# Patient Record
Sex: Male | Born: 1945 | Race: White | Hispanic: No | Marital: Married | Smoking: Never smoker
Health system: Southern US, Community
[De-identification: ages and names within clinical notes are randomized; demographics above are authoritative.]

## PROBLEM LIST (undated history)

## (undated) DIAGNOSIS — T7840XA Allergy, unspecified, initial encounter: Secondary | ICD-10-CM

## (undated) DIAGNOSIS — E785 Hyperlipidemia, unspecified: Secondary | ICD-10-CM

## (undated) DIAGNOSIS — M1990 Unspecified osteoarthritis, unspecified site: Secondary | ICD-10-CM

## (undated) DIAGNOSIS — I10 Essential (primary) hypertension: Secondary | ICD-10-CM

## (undated) DIAGNOSIS — C801 Malignant (primary) neoplasm, unspecified: Secondary | ICD-10-CM

## (undated) HISTORY — PX: COLONOSCOPY: SHX174

## (undated) HISTORY — DX: Hyperlipidemia, unspecified: E78.5

## (undated) HISTORY — PX: OTHER SURGICAL HISTORY: SHX169

## (undated) HISTORY — DX: Allergy, unspecified, initial encounter: T78.40XA

## (undated) HISTORY — PX: HERNIA REPAIR: SHX51

## (undated) HISTORY — DX: Essential (primary) hypertension: I10

## (undated) HISTORY — PX: PROSTATE SURGERY: SHX751

## (undated) HISTORY — PX: MINOR EXCISION OF ORAL LESION: SHX6466

---

## 1975-07-14 HISTORY — PX: VASECTOMY: SHX75

## 2009-02-04 ENCOUNTER — Encounter: Admission: RE | Admit: 2009-02-04 | Discharge: 2009-02-04 | Payer: Self-pay | Admitting: Family Medicine

## 2012-10-05 ENCOUNTER — Encounter: Payer: Self-pay | Admitting: Cardiology

## 2013-01-18 ENCOUNTER — Encounter: Payer: Self-pay | Admitting: Internal Medicine

## 2015-01-18 DIAGNOSIS — C61 Malignant neoplasm of prostate: Secondary | ICD-10-CM | POA: Insufficient documentation

## 2015-09-30 DIAGNOSIS — N393 Stress incontinence (female) (male): Secondary | ICD-10-CM | POA: Insufficient documentation

## 2015-09-30 DIAGNOSIS — N5231 Erectile dysfunction following radical prostatectomy: Secondary | ICD-10-CM | POA: Insufficient documentation

## 2016-01-14 ENCOUNTER — Encounter (HOSPITAL_COMMUNITY): Payer: Self-pay | Admitting: Emergency Medicine

## 2016-01-14 ENCOUNTER — Emergency Department (HOSPITAL_COMMUNITY): Payer: Medicare Other

## 2016-01-14 ENCOUNTER — Emergency Department (HOSPITAL_COMMUNITY)
Admission: EM | Admit: 2016-01-14 | Discharge: 2016-01-14 | Disposition: A | Discharge status: Home / Self Care | Payer: Medicare Other | Attending: Emergency Medicine | Admitting: Emergency Medicine

## 2016-01-14 DIAGNOSIS — R51 Headache: Secondary | ICD-10-CM | POA: Diagnosis present

## 2016-01-14 DIAGNOSIS — Z794 Long term (current) use of insulin: Secondary | ICD-10-CM | POA: Insufficient documentation

## 2016-01-14 DIAGNOSIS — R42 Dizziness and giddiness: Secondary | ICD-10-CM | POA: Diagnosis not present

## 2016-01-14 DIAGNOSIS — Z7982 Long term (current) use of aspirin: Secondary | ICD-10-CM | POA: Insufficient documentation

## 2016-01-14 DIAGNOSIS — M1990 Unspecified osteoarthritis, unspecified site: Secondary | ICD-10-CM | POA: Diagnosis not present

## 2016-01-14 HISTORY — DX: Malignant (primary) neoplasm, unspecified: C80.1

## 2016-01-14 HISTORY — DX: Unspecified osteoarthritis, unspecified site: M19.90

## 2016-01-14 LAB — URINALYSIS, ROUTINE W REFLEX MICROSCOPIC
BILIRUBIN URINE: NEGATIVE
Glucose, UA: >1000 mg/dL — AB
Hgb urine dipstick: NEGATIVE
KETONES UR: 15 mg/dL — AB
Leukocytes, UA: NEGATIVE
NITRITE: NEGATIVE
PH: 7.5 (ref 5.0–8.0)
Protein, ur: 30 mg/dL — AB
Specific Gravity, Urine: 1.03 (ref 1.005–1.030)

## 2016-01-14 LAB — COMPREHENSIVE METABOLIC PANEL
ALBUMIN: 4.5 g/dL (ref 3.5–5.0)
ALK PHOS: 61 U/L (ref 38–126)
ALT: 27 U/L (ref 17–63)
AST: 23 U/L (ref 15–41)
Anion gap: 10 (ref 5–15)
BILIRUBIN TOTAL: 1.2 mg/dL (ref 0.3–1.2)
BUN: 23 mg/dL — AB (ref 6–20)
CALCIUM: 9.1 mg/dL (ref 8.9–10.3)
CO2: 25 mmol/L (ref 22–32)
CREATININE: 0.98 mg/dL (ref 0.61–1.24)
Chloride: 102 mmol/L (ref 101–111)
GFR calc Af Amer: >60 mL/min (ref >60)
GFR calc non Af Amer: >60 mL/min (ref >60)
GLUCOSE: 201 mg/dL — AB (ref 65–99)
Potassium: 3.8 mmol/L (ref 3.5–5.1)
Sodium: 137 mmol/L (ref 135–145)
TOTAL PROTEIN: 7.5 g/dL (ref 6.5–8.1)

## 2016-01-14 LAB — CBC
HEMATOCRIT: 45.3 % (ref 39.0–52.0)
Hemoglobin: 16.2 g/dL (ref 13.0–17.0)
MCH: 31 pg (ref 26.0–34.0)
MCHC: 35.8 g/dL (ref 30.0–36.0)
MCV: 86.6 fL (ref 78.0–100.0)
Platelets: 162 10*3/uL (ref 150–400)
RBC: 5.23 MIL/uL (ref 4.22–5.81)
RDW: 12.9 % (ref 11.5–15.5)
WBC: 8.6 10*3/uL (ref 4.0–10.5)

## 2016-01-14 LAB — URINE MICROSCOPIC-ADD ON

## 2016-01-14 LAB — LIPASE, BLOOD: Lipase: 25 U/L (ref 11–51)

## 2016-01-14 MED ORDER — MECLIZINE HCL 25 MG PO TABS: 25.0000 mg | ORAL_TABLET | Freq: Three times a day (TID) | ORAL | Status: DC | PRN

## 2016-01-14 MED ORDER — SODIUM CHLORIDE 0.9 % IV BOLUS (SEPSIS)
1000.0000 mL | Freq: Once | INTRAVENOUS | Status: AC
  Administered 2016-01-14: 1000 mL via INTRAVENOUS

## 2016-01-14 MED ORDER — MECLIZINE HCL 25 MG PO TABS
25.0000 mg | ORAL_TABLET | Freq: Once | ORAL | Status: AC
  Administered 2016-01-14: 25 mg via ORAL
  Filled 2016-01-14 (×2): qty 1

## 2016-01-14 NOTE — ED Provider Notes (Signed)
CSN: [id number]     Arrival date & time 01/14/16  1334 History   First MD Initiated Contact with Patient 01/14/16 1401     Chief Complaint  Patient presents with  . Headache  . Nausea     (Consider location/radiation/quality/duration/timing/severity/associated sxs/prior Treatment) HPI Comments: 70 year old male with arthritis history no other significant medical problems no anticoagulants, baby aspirin daily presents with new onset head pressure and dizziness since this morning. No history of stroke, patient's father did have a stroke in his 59s. Patient has mild gait difficulty, intermittent dizziness worse with standing. No unilateral symptoms no speech or vision changes. No head injury or neck pain. No chest pain or short of breath  Patient is a 70 y.o. male presenting with headaches. The history is provided by the patient.  Headache Associated symptoms: dizziness and fatigue   Associated symptoms: no abdominal pain, no back pain, no congestion, no fever, no neck pain, no neck stiffness, no vomiting and no weakness     Past Medical History  Diagnosis Date  . Arthritis   . Cancer [facility])     prostate 01-2015   Past Surgical History  Procedure Laterality Date  . Hernia repair    . Prostate surgery      reomoved 01-2015   No family history on file. Social History  Substance Use Topics  . Smoking status: Never Smoker   . Smokeless tobacco: Never Used  . Alcohol Use: 1.2 oz/week    1 Glasses of wine, 1 Cans of beer per week    Review of Systems  Constitutional: Positive for diaphoresis, appetite change and fatigue. Negative for fever and chills.  HENT: Negative for congestion.   Eyes: Negative for visual disturbance.  Respiratory: Negative for shortness of breath.   Cardiovascular: Negative for chest pain.  Gastrointestinal: Negative for vomiting and abdominal pain.  Genitourinary: Negative for dysuria and flank pain.  Musculoskeletal: Negative for back pain, neck pain and  neck stiffness.  Skin: Negative for rash.  Neurological: Positive for dizziness and headaches. Negative for syncope, weakness and light-headedness.      Allergies  Sulfa antibiotics; Antihistamines, chlorpheniramine-type; Antihistamines, diphenhydramine-type; Antihistamines, loratadine-type; and Percocet  Home Medications   Prior to Admission medications   Medication Sig Start Date End Date Taking? Authorizing Provider  aspirin 325 MG EC tablet Take 650 mg by mouth every 6 (six) hours as needed for pain.   Yes Historical Provider, MD  aspirin EC 81 MG tablet Take 81 mg by mouth daily.    Yes Historical Provider, MD  Inulin (FIBER CHOICE) 1.5 g CHEW Chew 1 tablet by mouth daily.   Yes Historical Provider, MD  meclizine (ANTIVERT) 25 MG tablet Take 1 tablet (25 mg total) by mouth 3 (three) times daily as needed for dizziness. 01/14/16   Elnora Morrison, MD  Multiple Vitamin (MULTIVITAMIN) tablet Take 1 tablet by mouth daily.    Yes Historical Provider, MD  Omega-3 1000 MG CAPS Take 1 g by mouth daily.    Yes Historical Provider, MD   BP 175/93 mmHg  Pulse 96  Temp(Src) 97.9 F (36.6 C)  Resp 18  Ht 5\' 7"  (1.702 m)  Wt 163 lb (73.936 kg)  BMI 25.52 kg/m2  SpO2 97% Physical Exam  Constitutional: He is oriented to person, place, and time. He appears well-developed and well-nourished.  HENT:  Head: Normocephalic and atraumatic.  Mild dry mucous membranes  Eyes: Conjunctivae are normal. Right eye exhibits no discharge. Left eye exhibits no discharge.  Neck: Normal range of motion. Neck supple. No tracheal deviation present.  Cardiovascular: Normal rate and regular rhythm.   Pulmonary/Chest: Effort normal and breath sounds normal.  Abdominal: Soft. He exhibits no distension. There is no tenderness. There is no guarding.  Musculoskeletal: He exhibits no edema.  Neurological: He is alert and oriented to person, place, and time. GCS eye subscore is 4. GCS verbal subscore is 5. GCS motor  subscore is 6.  5+ strength in UE and LE with f/e at major joints. Sensation to palpation intact in UE and LE. CNs 2-12 grossly intact.  EOMFI.  PERRL.   Finger nose and coordination intact bilateral.   Visual fields intact to finger testing. No nystagmus Mild cautious gait   Skin: Skin is warm. No rash noted.  Psychiatric: He has a normal mood and affect.  Nursing note and vitals reviewed.   ED Course  Procedures (including critical care time) Labs Review Labs Reviewed  COMPREHENSIVE METABOLIC PANEL - Abnormal; Notable for the following:    Glucose, Bld 201 (*)    BUN 23 (*)    All other components within normal limits  URINALYSIS, ROUTINE W REFLEX MICROSCOPIC (NOT AT [facility]) - Abnormal; Notable for the following:    Glucose, UA >1000 (*)    Ketones, ur 15 (*)    Protein, ur 30 (*)    All other components within normal limits  URINE MICROSCOPIC-ADD ON - Abnormal; Notable for the following:    Squamous Epithelial / LPF 0-5 (*)    Bacteria, UA RARE (*)    All other components within normal limits  LIPASE, BLOOD  CBC    Imaging Review Mr Brain Wo Contrast  01/14/2016  CLINICAL DATA:  Nausea, vomiting with headache. Head pressure. Evaluate for acute CVA. EXAM: MRI HEAD WITHOUT CONTRAST TECHNIQUE: Multiplanar, multiecho pulse sequences of the brain and surrounding structures were obtained without intravenous contrast. COMPARISON:  None. FINDINGS: No evidence for acute infarction, hemorrhage, mass lesion, hydrocephalus, or extra-axial fluid. Normal for age cerebral volume. Mild subcortical and periventricular T2 and FLAIR hyperintensities, likely chronic microvascular ischemic change. Flow voids are maintained throughout the carotid, basilar, and vertebral arteries. There are no areas of chronic hemorrhage. Visualized calvarium, skull base, and upper cervical osseous structures unremarkable. Scalp and extracranial soft tissues, orbits, sinuses, and mastoids show no acute process.  IMPRESSION: No acute intracranial findings. Mild atrophy and small vessel disease. Electronically Signed   By: Staci Righter M.D.   On: 01/14/2016 15:38   I have personally reviewed and evaluated these images and lab results as part of my medical decision-making.   EKG Interpretation   Date/Time:  Tuesday January 14 2016 14:23:21 EDT Ventricular Rate:  85 PR Interval:    QRS Duration: 93 QT Interval:  377 QTC Calculation: 449 R Axis:   78 Text Interpretation:  Sinus rhythm Borderline repolarization abnormality  Confirmed by Karalynn Cottone MD, Errik Mitchelle [phone]) on 01/14/2016 2:49:38 PM      MDM   Final diagnoses:  Vertigo    Patient presents with new onset generalized head pressureand dizziness. With cautious gait age and history of cholesterol along with family history of stroke plan for MRI the brain to look for posterior stroke. Discussed other differential with patient.   Patient improved on reassessment. MRI negative for stroke or bleeding. Follow-up outpatient discussed.  Results and differential diagnosis were discussed with the patient/parent/guardian. Xrays were independently reviewed by myself.  Close follow up outpatient was discussed, comfortable with the plan.  Medications  sodium chloride 0.9 % bolus 1,000 mL (1,000 mLs Intravenous New Bag/Given 01/14/16 1539)  meclizine (ANTIVERT) tablet 25 mg (25 mg Oral Given 01/14/16 1540)    Filed Vitals:   01/14/16 1340 01/14/16 1511 01/14/16 1530  BP: 183/98 175/93   Pulse: 103 96   Temp: 97.9 F (36.6 C)  97.9 F (36.6 C)  Resp: 16 18   Height: 5\' 7"  (1.702 m)    Weight: 163 lb (73.936 kg)    SpO2: 96% 97%     Final diagnoses:  Vertigo      Elnora Morrison, MD 01/14/16 1731

## 2016-01-14 NOTE — ED Notes (Addendum)
While walking to restroom PT state he felt weak with headache RN have been made aware

## 2016-01-14 NOTE — ED Notes (Signed)
Patient transported to MRI 

## 2016-01-14 NOTE — ED Notes (Signed)
Per patient, he is complaining of n/v with headache.  Pressure to head is all over.  Some mild blurred vision.  Denies falls, trauma, and LOC.   This is a new occurrence for the patient.

## 2016-01-14 NOTE — Discharge Instructions (Signed)
Take meclizine as needed, stay well hydrated.  If you were given medicines take as directed.  If you are on coumadin or contraceptives realize their levels and effectiveness is altered by many different medicines.  If you have any reaction (rash, tongues swelling, other) to the medicines stop taking and see a physician.    If your blood pressure was elevated in the ER make sure you follow up for management with a primary doctor or return for chest pain, shortness of breath or stroke symptoms.  Please follow up as directed and return to the ER or see a physician for new or worsening symptoms.  Thank you. Filed Vitals:   01/14/16 1340 01/14/16 1511 01/14/16 1530  BP: 183/98 175/93   Pulse: 103 96   Temp: 97.9 F (36.6 C)  97.9 F (36.6 C)  Resp: 16 18   Height: 5\' 7"  (1.702 m)    Weight: 163 lb (73.936 kg)    SpO2: 96% 97%

## 2016-01-14 NOTE — ED Notes (Signed)
MRI available. Will hold off on CT per [facility]

## 2016-05-19 LAB — LIPID PANEL
CHOLESTEROL: 174 (ref 0–200)
HDL: 60 (ref 35–70)
LDL CALC: 79
Triglycerides: 171 — AB (ref 40–160)

## 2016-05-19 LAB — HEMOGLOBIN A1C: Hemoglobin A1C: 6

## 2017-02-10 ENCOUNTER — Ambulatory Visit (INDEPENDENT_AMBULATORY_CARE_PROVIDER_SITE_OTHER): Payer: 59 | Admitting: Physician Assistant

## 2017-02-10 ENCOUNTER — Encounter: Payer: Self-pay | Admitting: Physician Assistant

## 2017-02-10 DIAGNOSIS — I10 Essential (primary) hypertension: Secondary | ICD-10-CM | POA: Diagnosis not present

## 2017-02-10 NOTE — Patient Instructions (Signed)
It was great to meet you!  See you in a few months for your physical.

## 2017-02-10 NOTE — Progress Notes (Signed)
Kenneth Salazar is a 71 y.o. male here to [place] and Hypertension.  I acted as a Education administrator for [employer], PA-C Anselmo Pickler, LPN  History of Present Illness:   Chief Complaint  Patient presents with  . [place]  . Hypertension    Acute Concerns: HTN -- is currently taking 5 mg lisinopril, has been on for just a few years. No side effects. No chest pain, SOB, blurred vision, HA's, swelling in legs. Home BP taken daily, readings on average around 115/68.  Health Maintenance: Immunizations -- up to date Colonoscopy -- 04/15/2012 PSA -- managed by urology Weight -- Weight: 167 lb (75.8 kg)   Depression screen [facility] 2/9 02/10/2017  Decreased Interest 0  Down, Depressed, Hopeless 0  PHQ - 2 Score 0    No flowsheet data found.  Other providers/specialists: Dr. Alois Salazar -- eye doctor Bishop Limbo -- dentist Kenneth Salazar -- hearing Dr. Marguerita Salazar -- urology Dr. Michail Sermon -- GI (screening colonoscopy)   Past Medical History:  Diagnosis Date  . Arthritis   . Cancer [facility])    prostate 01-2015  . Hypertension      Social History   Social History  . Marital status: Unknown    Spouse name: N/A  . Number of children: N/A  . Years of education: N/A   Occupational History  . Not on file.   Social History Main Topics  . Smoking status: Never Smoker  . Smokeless tobacco: Never Used  . Alcohol use 1.2 oz/week    1 Glasses of wine, 1 Cans of beer per week  . Drug use: No  . Sexual activity: No   Other Topics Concern  . Not on file   Social History Narrative   Retired at age 47   Worked for telephone company   Married   3 children, 1 grandkid    Past Surgical History:  Procedure Laterality Date  . HERNIA REPAIR    . PROSTATE SURGERY     reomoved 01-2015  . VASECTOMY  1977    Family History  Problem Relation Age of Onset  . Diabetes Mother   . Stroke Father   . Diabetes Maternal Uncle     Allergies  Allergen Reactions  . Sulfa  Antibiotics Hives  . Antihistamines, Chlorpheniramine-Type Palpitations  . Antihistamines, Diphenhydramine-Type Palpitations  . Antihistamines, Loratadine-Type Palpitations  . Percocet [Oxycodone-Acetaminophen] Palpitations     Current Medications:   Current Outpatient Prescriptions:  .  aspirin 325 MG EC tablet, Take 325 mg by mouth every 6 (six) hours as needed for pain. , Disp: , Rfl:  .  aspirin EC 81 MG tablet, Take 81 mg by mouth daily. , Disp: , Rfl:  .  Inulin (FIBER CHOICE) 1.5 g CHEW, Chew 1 tablet by mouth daily., Disp: , Rfl:  .  lisinopril (PRINIVIL,ZESTRIL) 5 MG tablet, Take by mouth., Disp: , Rfl:  .  Multiple Vitamin (MULTIVITAMIN) tablet, Take 1 tablet by mouth daily. , Disp: , Rfl:  .  Multiple Vitamins-Minerals (OCULAR VITAMINS PO), Take 1 tablet by mouth daily., Disp: , Rfl:  .  Omega-3 1000 MG CAPS, Take 1 g by mouth daily. , Disp: , Rfl:  .  meclizine (ANTIVERT) 25 MG tablet, Take 1 tablet (25 mg total) by mouth 3 (three) times daily as needed for dizziness. (Patient not taking: Reported on 02/10/2017), Disp: 12 tablet, Rfl: 0   Review of Systems:   Review of Systems  Constitutional: Negative for chills,  fever, malaise/fatigue and weight loss.  Respiratory: Negative for cough and shortness of breath.   Cardiovascular: Negative for chest pain and palpitations.  Gastrointestinal: Negative for heartburn.    Vitals:   Vitals:   02/10/17 1042  BP: (!) 152/80  Pulse: 84  Temp: 99.2 F (37.3 C)  TempSrc: Oral  SpO2: 96%  Weight: 167 lb (75.8 kg)  Height: 5' 7.75" (1.721 m)     Body mass index is 25.58 kg/m.  Physical Exam:   Physical Exam  Constitutional: He appears well-developed. He is cooperative.  Non-toxic appearance. He does not have a sickly appearance. He does not appear ill. No distress.  Cardiovascular: Normal rate, regular rhythm, S1 normal, S2 normal, normal heart sounds and normal pulses.   No LE edema  Pulmonary/Chest: Effort normal and  breath sounds normal.  Neurological: He is alert. GCS eye subscore is 4. GCS verbal subscore is 5. GCS motor subscore is 6.  Skin: Skin is warm, dry and intact.  Psychiatric: He has a normal mood and affect. His speech is normal and behavior is normal.  Nursing note and vitals reviewed.     Assessment and Plan:    Problem List Items Addressed This Visit      Cardiovascular and Mediastinum   Hypertension    Currently well controlled on Lisinopril 5 mg daily. Follow-up in 3-4 months for physical. Continue to check blood pressures and bring for review. Follow-up sooner if needed.      Relevant Medications   lisinopril (PRINIVIL,ZESTRIL) 5 MG tablet       . Reviewed expectations re: course of current medical issues. . Discussed self-management of symptoms. . Outlined signs and symptoms indicating need for more acute intervention. . Patient verbalized understanding and all questions were answered. . See orders for this visit as documented in the electronic medical record. . Patient received an After-Visit Summary.  CMA or LPN served as scribe during this visit. History, Physical, and Plan performed by medical provider. Documentation and orders reviewed and attested to.  [name], PA-C

## 2017-02-10 NOTE — Assessment & Plan Note (Signed)
Currently well controlled on Lisinopril 5 mg daily. Follow-up in 3-4 months for physical. Continue to check blood pressures and bring for review. Follow-up sooner if needed.

## 2017-02-16 ENCOUNTER — Telehealth: Payer: Self-pay | Admitting: Physician Assistant

## 2017-02-16 NOTE — Telephone Encounter (Signed)
ROI faxed to Dr. Leighton Ruff

## 2017-06-07 ENCOUNTER — Ambulatory Visit (INDEPENDENT_AMBULATORY_CARE_PROVIDER_SITE_OTHER): Payer: 59 | Admitting: *Deleted

## 2017-06-07 ENCOUNTER — Encounter: Payer: 59 | Admitting: Physician Assistant

## 2017-06-07 ENCOUNTER — Ambulatory Visit: Payer: 59 | Admitting: *Deleted

## 2017-06-07 ENCOUNTER — Encounter: Payer: Self-pay | Admitting: *Deleted

## 2017-06-07 VITALS — BP 142/70 | HR 91 | Resp 16 | Ht 68.0 in | Wt 167.4 lb

## 2017-06-07 DIAGNOSIS — Z1159 Encounter for screening for other viral diseases: Secondary | ICD-10-CM | POA: Diagnosis not present

## 2017-06-07 DIAGNOSIS — Z0000 Encounter for general adult medical examination without abnormal findings: Secondary | ICD-10-CM

## 2017-06-07 DIAGNOSIS — Z23 Encounter for immunization: Secondary | ICD-10-CM

## 2017-06-07 NOTE — Telephone Encounter (Signed)
No records from Dr. Leighton Ruff

## 2017-06-07 NOTE — Progress Notes (Signed)
I have personally reviewed the Medicare Annual Wellness questionnaire and have noted 1. The patient's medical and social history 2. Their use of alcohol, tobacco or illicit drugs 3. Their current medications and supplements 4. The patient's functional ability including ADL's, fall risks, home safety risks and hearing or visual impairment. 5. Diet and physical activities 6. Evidence for depression or mood disorders 7. Reviewed Updated provider list, see scanned forms and [facility] Snapshot.   The patients weight, height, BMI and visual acuity have been recorded in the chart I have made referrals, counseling and provided education to the patient based review of the above and I have provided the pt with a written personalized care plan for preventive services.  I have provided the patient with a copy of your personalized plan for preventive services. Instructed to take the time to review along with their updated medication list.  Kenneth Salazar  

## 2017-06-07 NOTE — Progress Notes (Signed)
PCP notes:   Health maintenance: Hepatitis C Screening: Future orders placed for lab draw on 06/10/17. Flu: Received today.  Abnormal screenings: BP 142/80, pt states that is runs high at the doctors office. He will bring his BP log to his appt with Dr Juleen China.   Patient concerns: Right side hip pain started 3 months ago.    Nurse concerns: None.   Next PCP appt: 06/10/17 9:20

## 2017-06-07 NOTE — Progress Notes (Signed)
Pre visit review using our clinic review tool, if applicable. No additional management support is needed unless otherwise documented below in the visit note. 

## 2017-06-07 NOTE — Progress Notes (Signed)
Subjective:   Kenneth Salazar is a 71 y.o. male who presents for Medicare Annual/Subsequent preventive examination.   Lives with wife in two story family home. Mostly lives on first story. Sons live upstairs.   Review of Systems:  No ROS.  Medicare Wellness Visit. Additional risk factors are reflected in the social history.   Sleeps 8 hrs but gets up at least 4 times to use restroom. Wakes up feeling rested. Naps once/day occasionally.  Cardiac Risk Factors include: advanced age (>72men, >52 women);hypertension;male gender     Objective:    Vitals: BP (!) 142/70 (BP Location: Left Arm, Patient Position: Sitting, Cuff Size: Normal)   Pulse 91   Resp 16   Ht 5\' 8"  (1.727 m)   Wt 167 lb 6.4 oz (75.9 kg)   SpO2 98%   BMI 25.45 kg/m   Body mass index is 25.45 kg/m.  Tobacco Social History   Tobacco Use  Smoking Status Never Smoker  Smokeless Tobacco Never Used     Counseling given: Not Answered   Past Medical History:  Diagnosis Date  . Arthritis   . Cancer [facility])    prostate 01-2015  . Hypertension    Past Surgical History:  Procedure Laterality Date  . HERNIA REPAIR    . PROSTATE SURGERY     reomoved 01-2015  . VASECTOMY  1977   Family History  Problem Relation Age of Onset  . Diabetes Mother   . Stroke Father   . Diabetes Maternal Uncle    Social History   Substance and Sexual Activity  Sexual Activity No    Outpatient Encounter Medications as of 06/07/2017  Medication Sig  . aspirin 325 MG EC tablet Take 325 mg by mouth every 6 (six) hours as needed for pain.   Marland Kitchen aspirin EC 81 MG tablet Take 81 mg by mouth daily.   . Inulin (FIBER CHOICE) 1.5 g CHEW Chew 6 tablets by mouth daily.   Marland Kitchen lisinopril (PRINIVIL,ZESTRIL) 5 MG tablet Take 10 mg by mouth.   . meclizine (ANTIVERT) 25 MG tablet Take 1 tablet (25 mg total) by mouth 3 (three) times daily as needed for dizziness.  . Multiple Vitamin (MULTIVITAMIN) tablet Take 1 tablet by mouth daily.   .  Multiple Vitamins-Minerals (OCULAR VITAMINS PO) Take 1 tablet by mouth daily.  . Omega-3 1000 MG CAPS Take 1 g by mouth daily.    No facility-administered encounter medications on file as of 06/07/2017.     Activities of Daily Living In your present state of health, do you have any difficulty performing the following activities: 06/07/2017  Hearing? N  Vision? N  Difficulty concentrating or making decisions? N  Walking or climbing stairs? N  Dressing or bathing? N  Doing errands, shopping? N  Preparing Food and eating ? N  Using the Toilet? N  In the past six months, have you accidently leaked urine? N  Do you have problems with loss of bowel control? N  Managing your Medications? N  Managing your Finances? N  Housekeeping or managing your Housekeeping? N  Some recent data might be hidden    Patient Care Team: [name], [place] as PCP - General (Physician Assistant) Ollen Bowl, MD as Consulting Physician (Urology) Gastroenterology, Sadie Haber as Consulting Physician (Gastroenterology) Juluis Rainier as Consulting Physician (Optometry) Dentistry, [facility] as Consulting Physician (Dentistry)   Assessment:    Physical assessment deferred to PCP.  Exercise Activities and Dietary recommendations Current Exercise Habits: Home exercise routine,  Type of exercise: walking(Atleast 1 mile 3x/week), Time (Minutes): 20, Frequency (Times/Week): 3, Weekly Exercise (Minutes/Week): 60, Intensity: Moderate, Exercise limited by: None identified  Eats 3 meals/day. 4 meals/week eaten out. States meals are moderately healthy. States he eats a lot of potatoes and pasta. States he keeps his portion size small with the processed carb. Drinks 1 glass 1% milk and at least 5 glasses water. Mostly drinks unsweet tea. Uses artificial sugar.   Goals    . Maintain current health status, not gain weight.      Fall Risk Fall Risk  06/07/2017 02/10/2017  Falls in the past year? No No    Depression Screen PHQ 2/9 Scores 06/07/2017 02/10/2017  PHQ - 2 Score 0 0  PHQ- 9 Score 0 -  PHQ2 and PHQ9 screening completed. No signs or symptoms of depression. Spent a total of 9 minutes on this topic.  Cognitive Function MMSE - Mini Mental State Exam 06/07/2017  Orientation to time 5  Orientation to Place 5  Registration 3  Attention/ Calculation 5  Recall 3  Language- name 2 objects 2  Language- repeat 1  Language- follow 3 step command 3  Language- read & follow direction 1  Write a sentence 1  Copy design 1  Total score 30        Immunization History  Administered Date(s) Administered  . Influenza, High Dose Seasonal PF 06/07/2017  . Influenza-Unspecified 04/21/2016  . Pneumococcal Conjugate-13 05/18/2014  . Pneumococcal Polysaccharide-23 01/29/2009  . Td 01/17/2008  . Zoster 01/17/2008   Screening Tests Health Maintenance  Topic Date Due  . Hepatitis C Screening  [birth year]  . INFLUENZA VACCINE  02/10/2017  . TETANUS/TDAP  01/16/2018  . COLONOSCOPY  07/24/2025  . PNA vac Low Risk Adult  Completed      Plan:   Follow up with PCP as directed.  I have personally reviewed and noted the following in the patient's chart:   . Medical and social history . Use of alcohol, tobacco or illicit drugs  . Current medications and supplements . Functional ability and status . Nutritional status . Physical activity . Advanced directives . List of other physicians . Vitals . Screenings to include cognitive, depression, and falls . Referrals and appointments  In addition, I have reviewed and discussed with patient certain preventive protocols, quality metrics, and best practice recommendations. A written personalized care plan for preventive services as well as general preventive health recommendations were provided to patient.     Williemae Area, RN  06/07/2017

## 2017-06-07 NOTE — Patient Instructions (Addendum)
Kenneth Salazar , Thank you for taking time to come for your Medicare Wellness Visit. I appreciate your ongoing commitment to your health goals. Please review the following plan we discussed and let me know if I can assist you in the future.   These are the goals we discussed: Goals    . Maintain current health status, not gain weight.       This is a list of the screening recommended for you and due dates:  Health Maintenance  Topic Date Due  .  Hepatitis C: One time screening is recommended by Center for Disease Control  (CDC) for  adults born from [birth year] through 1967.   1945/11/16  . Flu Shot  02/10/2017  . Tetanus Vaccine  01/16/2018  . Colon Cancer Screening  07/24/2025  . Pneumonia vaccines  Completed   Preventive Care for Adults  A healthy lifestyle and preventive care can promote health and wellness. Preventive health guidelines for adults include the following key practices.  . A routine yearly physical is a good way to check with your health care provider about your health and preventive screening. It is a chance to share any concerns and updates on your health and to receive a thorough exam.  . Visit your dentist for a routine exam and preventive care every 6 months. Brush your teeth twice a day and floss once a day. Good oral hygiene prevents tooth decay and gum disease.  . The frequency of eye exams is based on your age, health, family medical history, use  of contact lenses, and other factors. Follow your health care provider's recommendations for frequency of eye exams.  . Eat a healthy diet. Foods like vegetables, fruits, whole grains, low-fat dairy products, and lean protein foods contain the nutrients you need without too many calories. Decrease your intake of foods high in solid fats, added sugars, and salt. Eat the right amount of calories for you. Get information about a proper diet from your health care provider, if necessary.  . Regular physical exercise is one of  the most important things you can do for your health. Most adults should get at least 150 minutes of moderate-intensity exercise (any activity that increases your heart rate and causes you to sweat) each week. In addition, most adults need muscle-strengthening exercises on 2 or more days a week.  Silver Sneakers may be a benefit available to you. To determine eligibility, you may visit the website: www.silversneakers.com or contact program at 225-603-2856 Mon-Fri between 8AM-8PM.   . Maintain a healthy weight. The body mass index (BMI) is a screening tool to identify possible weight problems. It provides an estimate of body fat based on height and weight. Your health care provider can find your BMI and can help you achieve or maintain a healthy weight.   For adults 20 years and older: ? A BMI below 18.5 is considered underweight. ? A BMI of 18.5 to 24.9 is normal. ? A BMI of 25 to 29.9 is considered overweight. ? A BMI of 30 and above is considered obese.   . Maintain normal blood lipids and cholesterol levels by exercising and minimizing your intake of saturated fat. Eat a balanced diet with plenty of fruit and vegetables. Blood tests for lipids and cholesterol should begin at age 53 and be repeated every 5 years. If your lipid or cholesterol levels are high, you are over 50, or you are at high risk for heart disease, you may need your cholesterol levels checked more  frequently. Ongoing high lipid and cholesterol levels should be treated with medicines if diet and exercise are not working.  . If you smoke, find out from your health care provider how to quit. If you do not use tobacco, please do not start.  . If you choose to drink alcohol, please do not consume more than 2 drinks per day. One drink is considered to be 12 ounces (355 mL) of beer, 5 ounces (148 mL) of wine, or 1.5 ounces (44 mL) of liquor.  . If you are 52-45 years old, ask your health care provider if you should take aspirin to  prevent strokes.  . Use sunscreen. Apply sunscreen liberally and repeatedly throughout the day. You should seek shade when your shadow is shorter than you. Protect yourself by wearing long sleeves, pants, a wide-brimmed hat, and sunglasses year round, whenever you are outdoors.  . Once a month, do a whole body skin exam, using a mirror to look at the skin on your back. Tell your health care provider of new moles, moles that have irregular borders, moles that are larger than a pencil eraser, or moles that have changed in shape or color.

## 2017-06-10 ENCOUNTER — Ambulatory Visit (INDEPENDENT_AMBULATORY_CARE_PROVIDER_SITE_OTHER): Payer: Medicare Other | Admitting: Family Medicine

## 2017-06-10 ENCOUNTER — Encounter: Payer: Self-pay | Admitting: Family Medicine

## 2017-06-10 VITALS — BP 148/82 | HR 78 | Temp 98.6°F | Resp 14 | Ht 67.5 in | Wt 166.6 lb

## 2017-06-10 DIAGNOSIS — M19012 Primary osteoarthritis, left shoulder: Secondary | ICD-10-CM | POA: Diagnosis not present

## 2017-06-10 DIAGNOSIS — Z0000 Encounter for general adult medical examination without abnormal findings: Secondary | ICD-10-CM

## 2017-06-10 DIAGNOSIS — Z8546 Personal history of malignant neoplasm of prostate: Secondary | ICD-10-CM | POA: Diagnosis not present

## 2017-06-10 DIAGNOSIS — E88819 Insulin resistance, unspecified: Secondary | ICD-10-CM | POA: Insufficient documentation

## 2017-06-10 DIAGNOSIS — E8881 Metabolic syndrome: Secondary | ICD-10-CM

## 2017-06-10 DIAGNOSIS — E785 Hyperlipidemia, unspecified: Secondary | ICD-10-CM | POA: Insufficient documentation

## 2017-06-10 DIAGNOSIS — M25551 Pain in right hip: Secondary | ICD-10-CM

## 2017-06-10 DIAGNOSIS — G8929 Other chronic pain: Secondary | ICD-10-CM

## 2017-06-10 DIAGNOSIS — E1169 Type 2 diabetes mellitus with other specified complication: Secondary | ICD-10-CM

## 2017-06-10 DIAGNOSIS — E663 Overweight: Secondary | ICD-10-CM | POA: Diagnosis not present

## 2017-06-10 DIAGNOSIS — M25542 Pain in joints of left hand: Secondary | ICD-10-CM | POA: Diagnosis not present

## 2017-06-10 DIAGNOSIS — Z1159 Encounter for screening for other viral diseases: Secondary | ICD-10-CM

## 2017-06-10 DIAGNOSIS — M25541 Pain in joints of right hand: Secondary | ICD-10-CM | POA: Diagnosis not present

## 2017-06-10 DIAGNOSIS — I10 Essential (primary) hypertension: Secondary | ICD-10-CM

## 2017-06-10 LAB — CBC WITH DIFFERENTIAL/PLATELET
Basophils Absolute: 0 10*3/uL (ref 0.0–0.1)
Basophils Relative: 0.5 % (ref 0.0–3.0)
Eosinophils Absolute: 0.1 10*3/uL (ref 0.0–0.7)
Eosinophils Relative: 2.1 % (ref 0.0–5.0)
HCT: 45.4 % (ref 39.0–52.0)
Hemoglobin: 15.4 g/dL (ref 13.0–17.0)
Lymphocytes Relative: 25.7 % (ref 12.0–46.0)
Lymphs Abs: 1.1 10*3/uL (ref 0.7–4.0)
MCHC: 33.9 g/dL (ref 30.0–36.0)
MCV: 90.8 fl (ref 78.0–100.0)
Monocytes Absolute: 0.4 10*3/uL (ref 0.1–1.0)
Monocytes Relative: 9.5 % (ref 3.0–12.0)
Neutro Abs: 2.7 10*3/uL (ref 1.4–7.7)
Neutrophils Relative %: 62.2 % (ref 43.0–77.0)
Platelets: 153 10*3/uL (ref 150.0–400.0)
RBC: 5 Mil/uL (ref 4.22–5.81)
RDW: 13.5 % (ref 11.5–15.5)
WBC: 4.4 10*3/uL (ref 4.0–10.5)

## 2017-06-10 LAB — COMPREHENSIVE METABOLIC PANEL
ALT: 19 U/L (ref 0–53)
AST: 18 U/L (ref 0–37)
Albumin: 4.1 g/dL (ref 3.5–5.2)
Alkaline Phosphatase: 58 U/L (ref 39–117)
BUN: 20 mg/dL (ref 6–23)
CO2: 28 mEq/L (ref 19–32)
Calcium: 9.3 mg/dL (ref 8.4–10.5)
Chloride: 103 mEq/L (ref 96–112)
Creatinine, Ser: 1.02 mg/dL (ref 0.40–1.50)
GFR: 75.9 mL/min (ref >60.00)
Glucose, Bld: 128 mg/dL — ABNORMAL HIGH (ref 70–99)
Potassium: 4 mEq/L (ref 3.5–5.1)
Sodium: 138 mEq/L (ref 135–145)
Total Bilirubin: 0.8 mg/dL (ref 0.2–1.2)
Total Protein: 6.9 g/dL (ref 6.0–8.3)

## 2017-06-10 LAB — HEMOGLOBIN A1C: Hgb A1c MFr Bld: 6.2 % (ref 4.6–6.5)

## 2017-06-10 LAB — LIPID PANEL
Cholesterol: 163 mg/dL (ref 0–200)
HDL: 54.5 mg/dL (ref >39.00)
NonHDL: 108.84
Total CHOL/HDL Ratio: 3
Triglycerides: 213 mg/dL — ABNORMAL HIGH (ref 0.0–149.0)
VLDL: 42.6 mg/dL — ABNORMAL HIGH (ref 0.0–40.0)

## 2017-06-10 LAB — LDL CHOLESTEROL, DIRECT: Direct LDL: 79 mg/dL

## 2017-06-10 MED ORDER — LISINOPRIL 10 MG PO TABS: 10.0000 mg | ORAL_TABLET | Freq: Every day | ORAL | 3 refills | Status: DC

## 2017-06-10 NOTE — Progress Notes (Signed)
Subjective:    Kenneth Salazar is a 71 y.o. male who presents today for his Complete Annual Exam. He feels well. He reports that he does not exercise but is active. He reports he is sleeping fairly well but gets up at least 4 times to use restroom. Wakes up feeling rested. Naps once/day occasionally.   Health Maintenance Due  Topic Date Due  . Hepatitis C Screening  1946/06/16  . FOOT EXAM  07/14/1953  . OPHTHALMOLOGY EXAM  [birth year]/14/1955  . HEMOGLOBIN A1C  11/16/2016   Cardiac Risk Factors include: advanced age (>47men, >69 women); hypertension; male gender.  Multiple joint pain, especially left shoulder, right hip, and hands diffusely. Takes Motrin prn.   Note: Patient ran out of Lisinopril for a few days. Noted elevations in SBP not usual for him. Cardiovascular ROS: no chest pain or dyspnea on exertion.  Hx of prostate cancer - will see Urology in 2 weeks.   PMHx, SurgHx, SocialHx, Medications, and Allergies were reviewed in the Visit Navigator and updated as appropriate.   Past Medical History:  Diagnosis Date  . Arthritis   . Cancer [facility])    prostate 01-2015  . Hypertension     Past Surgical History:  Procedure Laterality Date  . HERNIA REPAIR    . PROSTATE SURGERY     reomoved 01-2015  . VASECTOMY  1977    Family History  Problem Relation Age of Onset  . Diabetes Mother   . Stroke Father   . Diabetes Maternal Uncle    Social History   Tobacco Use  . Smoking status: Never Smoker  . Smokeless tobacco: Never Used  Substance Use Topics  . Alcohol use: Yes    Alcohol/week: 1.2 oz    Types: 1 Glasses of wine, 1 Cans of beer per week    Comment: 1 beer or wine/week  . Drug use: No   Review of Systems:   Pertinent items are noted in the HPI. Otherwise, ROS is negative.  Objective:    Vitals:   06/10/17 0900  BP: (!) 148/82  Pulse: 78  Resp: 14  Temp: 98.6 F (37 C)   Body mass index is 25.71 kg/m.  General  Alert, cooperative, no distress, appears  stated age  Head:  Normocephalic, without obvious abnormality, atraumatic  Eyes:  PERRL, conjunctiva/corneas clear, EOM's intact, fundi benign, both eyes       Ears:  Normal TM's and external ear canals, both ears  Nose: Nares normal, septum midline, mucosa normal, no drainage or sinus tenderness  Throat: Lips, mucosa, and tongue normal; teeth and gums normal  Neck: Supple, symmetrical, trachea midline, no adenopathy; thyroid: no enlargement/tenderness/nodules; no carotid bruit or JVD  Back:   Symmetric, no curvature, ROM normal, no CVA tenderness  Lungs:   Clear to auscultation bilaterally, respirations unlabored  Chest Wall:  No tenderness or deformity  Heart:  Regular rate and rhythm, S1 and S2 normal, no murmur, rub or gallop  Abdomen:   Soft, non-tender, bowel sounds active all four quadrants, no masses, no organomegaly  Extremities: Extremities normal, atraumatic, no cyanosis or edema  Prostate : Not done.   Skin: Skin color, texture, turgor normal, no rashes or lesions  Lymph: Cervical, supraclavicular, and axillary nodes normal  Neurologic: CNII-XII grossly intact. Normal strength, sensation and reflexes throughout   Physical Exam  Musculoskeletal:       Left shoulder: He exhibits decreased range of motion and bony tenderness.       Right  hip: He exhibits bony tenderness.       Right hand: He exhibits decreased range of motion.       Left hand: He exhibits decreased range of motion.   AssessmentPlan:   Dixon was seen today for annual exam.  Diagnoses and all orders for this visit:  Routine physical examination  History of prostate cancer -     CBC with Differential/Platelet  Insulin resistance -     Comprehensive metabolic panel -     Hemoglobin A1c  Hyperlipidemia associated with type 2 diabetes mellitus (HCC) -     Comprehensive metabolic panel -     Lipid panel  Essential hypertension -     lisinopril (PRINIVIL,ZESTRIL) 10 MG tablet; Take 1 tablet (10 mg  total) by mouth daily. -     Comprehensive metabolic panel -     Lipid panel -     Hemoglobin A1c  Need for hepatitis C screening test -     Hepatitis C Antibody  Overweight (BMI 25.0-29.9) Comments: The patient is asked to make an attempt to improve diet and exercise patterns to aid in medical management of this problem.   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 Injection if flare to hip or shoulder.    Patient Counseling: [x]   Nutrition: Stressed importance of moderation in sodium/caffeine intake, saturated fat and cholesterol, caloric balance, sufficient intake of fresh fruits, vegetables, and fiber  [x]   Stressed the importance of regular exercise.   []   Substance Abuse: Discussed cessation/primary prevention of tobacco, alcohol, or other drug use; driving or other dangerous activities under the influence; availability of treatment for abuse.   [x]   Injury prevention: Discussed safety belts, safety helmets, smoke detector, smoking near bedding or upholstery.   []   Sexuality: Discussed sexually transmitted diseases, partner selection, use of condoms, avoidance of unintended pregnancy  and contraceptive alternatives.   [x]   Dental health: Discussed importance of regular tooth brushing, flossing, and dental visits.  [x]   Health maintenance and immunizations reviewed. Please refer to Health maintenance section.    Briscoe Deutscher, DO [place]

## 2017-06-11 LAB — HEPATITIS C ANTIBODY
Hepatitis C Ab: NONREACTIVE
SIGNAL TO CUT-OFF: 0.01 (ref <1.00)

## 2017-06-15 ENCOUNTER — Ambulatory Visit: Payer: Medicare Other | Admitting: Family Medicine

## 2017-06-15 ENCOUNTER — Encounter: Payer: Self-pay | Admitting: Family Medicine

## 2017-06-15 VITALS — BP 140/74 | HR 75 | Temp 98.0°F | Wt 167.0 lb

## 2017-06-15 DIAGNOSIS — E782 Mixed hyperlipidemia: Secondary | ICD-10-CM

## 2017-06-15 DIAGNOSIS — E8881 Metabolic syndrome: Secondary | ICD-10-CM | POA: Diagnosis not present

## 2017-06-15 DIAGNOSIS — E663 Overweight: Secondary | ICD-10-CM | POA: Diagnosis not present

## 2017-06-15 DIAGNOSIS — E88819 Insulin resistance, unspecified: Secondary | ICD-10-CM

## 2017-06-15 MED ORDER — METFORMIN HCL 1000 MG PO TABS: 1000.0000 mg | ORAL_TABLET | Freq: Two times a day (BID) | ORAL | 3 refills | Status: DC

## 2017-06-15 NOTE — Progress Notes (Signed)
Kenneth Salazar is a 71 y.o. male is here for follow up.  History of Present Illness:   HPI: See Assessment and Plan section for Problem Based Charting of issues discussed today.  Health Maintenance Due  Topic Date Due  . FOOT EXAM  07/14/1955  . OPHTHALMOLOGY EXAM  07/14/1955   Depression screen [facility] 2/9 06/10/2017 06/07/2017 02/10/2017  Decreased Interest 0 0 0  Down, Depressed, Hopeless 0 0 0  PHQ - 2 Score 0 0 0  Altered sleeping 0 0 -  Tired, decreased energy 0 0 -  Change in appetite 0 0 -  Feeling bad or failure about yourself  0 0 -  Trouble concentrating 3 0 -  Moving slowly or fidgety/restless 0 0 -  Suicidal thoughts 0 0 -  PHQ-9 Score 3 0 -  Difficult doing work/chores - Not difficult at all -   PMHx, SurgHx, SocialHx, FamHx, Medications, and Allergies were reviewed in the Visit Navigator and updated as appropriate.   Patient Active Problem List   Diagnosis Date Noted  . Insulin resistance 06/10/2017  . HLD (hyperlipidemia) 06/10/2017  . Overweight (BMI 25.0-29.9) 06/10/2017  . Primary osteoarthritis of left shoulder 06/10/2017  . Chronic hip pain, right 06/10/2017  . Arthralgia of both hands 06/10/2017  . Hypertension 02/10/2017  . Erectile dysfunction following radical prostatectomy 09/30/2015  . Male stress incontinence 09/30/2015  . Malignant neoplasm of prostate ([place]) 01/18/2015   Social History   Tobacco Use  . Smoking status: Never Smoker  . Smokeless tobacco: Never Used  Substance Use Topics  . Alcohol use: Yes    Alcohol/week: 1.2 oz    Types: 1 Glasses of wine, 1 Cans of beer per week    Comment: 1 beer or wine/week  . Drug use: No   Current Medications and Allergies:   Current Outpatient Medications:  .  aspirin 325 MG EC tablet, Take 325 mg by mouth every 6 (six) hours as needed for pain. , Disp: , Rfl:  .  aspirin EC 81 MG tablet, Take 81 mg by mouth daily. , Disp: , Rfl:  .  Inulin (FIBER CHOICE) 1.5 g CHEW, Chew 6 tablets by mouth  daily. , Disp: , Rfl:  .  lisinopril (PRINIVIL,ZESTRIL) 10 MG tablet, Take 1 tablet (10 mg total) by mouth daily., Disp: 90 tablet, Rfl: 3 .  meclizine (ANTIVERT) 25 MG tablet, Take 1 tablet (25 mg total) by mouth 3 (three) times daily as needed for dizziness., Disp: 12 tablet, Rfl: 0 .  Multiple Vitamin (MULTIVITAMIN) tablet, Take 1 tablet by mouth daily. , Disp: , Rfl:  .  Multiple Vitamins-Minerals (OCULAR VITAMINS PO), Take 1 tablet by mouth daily., Disp: , Rfl:  .  Omega-3 1000 MG CAPS, Take 1 g by mouth daily. , Disp: , Rfl:    Allergies  Allergen Reactions  . Sulfa Antibiotics Hives  . Antihistamines, Chlorpheniramine-Type Palpitations  . Antihistamines, Diphenhydramine-Type Palpitations  . Antihistamines, Loratadine-Type Palpitations  . Percocet [Oxycodone-Acetaminophen] Palpitations   Review of Systems   Pertinent items are noted in the HPI. Otherwise, ROS is negative.  Vitals:   Vitals:   06/15/17 0736  BP: 140/74  Pulse: 75  Temp: 98 F (36.7 C)  TempSrc: Oral  SpO2: 98%  Weight: 167 lb (75.8 kg)     Body mass index is 25.77 kg/m. Physical Exam:   Physical Exam  Constitutional: He is oriented to person, place, and time. He appears well-developed and well-nourished. No distress.  HENT:  Head: Normocephalic and atraumatic.  Eyes: Conjunctivae and EOM are normal. Pupils are equal, round, and reactive to light.  Neck: Normal range of motion.  Musculoskeletal: Normal range of motion.  Neurological: He is alert and oriented to person, place, and time.  Skin: Skin is warm and dry.  Psychiatric: He has a normal mood and affect. His behavior is normal. Judgment and thought content normal.  Nursing note and vitals reviewed.   Results for orders placed or performed in visit on 06/10/17  Hepatitis C Antibody  Result Value Ref Range   Hepatitis C Ab NON-REACTIVE NON-REACTI   SIGNAL TO CUT-OFF 0.01 <1.00  CBC with Differential/Platelet  Result Value Ref Range   WBC  4.4 4.0 - 10.5 K/uL   RBC 5.00 4.22 - 5.81 Mil/uL   Hemoglobin 15.4 13.0 - 17.0 g/dL   HCT 45.4 39.0 - 52.0 %   MCV 90.8 78.0 - 100.0 fl   MCHC 33.9 30.0 - 36.0 g/dL   RDW 13.5 11.5 - 15.5 %   Platelets 153.0 150.0 - 400.0 K/uL   Neutrophils Relative % 62.2 43.0 - 77.0 %   Lymphocytes Relative 25.7 12.0 - 46.0 %   Monocytes Relative 9.5 3.0 - 12.0 %   Eosinophils Relative 2.1 0.0 - 5.0 %   Basophils Relative 0.5 0.0 - 3.0 %   Neutro Abs 2.7 1.4 - 7.7 K/uL   Lymphs Abs 1.1 0.7 - 4.0 K/uL   Monocytes Absolute 0.4 0.1 - 1.0 K/uL   Eosinophils Absolute 0.1 0.0 - 0.7 K/uL   Basophils Absolute 0.0 0.0 - 0.1 K/uL  Comprehensive metabolic panel  Result Value Ref Range   Sodium 138 135 - 145 mEq/L   Potassium 4.0 3.5 - 5.1 mEq/L   Chloride 103 96 - 112 mEq/L   CO2 28 19 - 32 mEq/L   Glucose, Bld 128 (H) 70 - 99 mg/dL   BUN 20 6 - 23 mg/dL   Creatinine, Ser 1.02 0.40 - 1.50 mg/dL   Total Bilirubin 0.8 0.2 - 1.2 mg/dL   Alkaline Phosphatase 58 39 - 117 U/L   AST 18 0 - 37 U/L   ALT 19 0 - 53 U/L   Total Protein 6.9 6.0 - 8.3 g/dL   Albumin 4.1 3.5 - 5.2 g/dL   Calcium 9.3 8.4 - 10.5 mg/dL   GFR 75.90 >60.00 mL/min  Lipid panel  Result Value Ref Range   Cholesterol 163 0 - 200 mg/dL   Triglycerides 213.0 (H) 0.0 - 149.0 mg/dL   HDL 54.50 >39.00 mg/dL   VLDL 42.6 (H) 0.0 - 40.0 mg/dL   Total CHOL/HDL Ratio 3    NonHDL 108.84   Hemoglobin A1c  Result Value Ref Range   Hgb A1c MFr Bld 6.2 4.6 - 6.5 %  LDL cholesterol, direct  Result Value Ref Range   Direct LDL 79.0 mg/dL   Assessment and Plan:   Kenneth Salazar was seen today for follow-up.  Diagnoses and all orders for this visit:  Insulin resistance Comments: After discussion, patient would like to start below medication. Expectations, risks, and potential side effects reviewed. Will recheck labs - see AVS for instructions.  Orders: -     metFORMIN (GLUCOPHAGE) 1000 MG tablet; Take 1 tablet (1,000 mg total) by mouth 2 (two)  times daily with a meal.  Mixed hyperlipidemia Comments: I expect the Metformin to help improve the panel since triglycerides are elevated.   Overweight (BMI 25.0-29.9) Comments: The patient is asked to make an  attempt to improve diet and exercise patterns to aid in medical management of this problem.    . Reviewed expectations re: course of current medical issues. . Discussed self-management of symptoms. . Outlined signs and symptoms indicating need for more acute intervention. . Patient verbalized understanding and all questions were answered. Marland Kitchen Health Maintenance issues including appropriate healthy diet, exercise, and smoking avoidance were discussed with patient. . See orders for this visit as documented in the electronic medical record. . Patient received an After Visit Summary.  Briscoe Deutscher, DO [place] [place] 06/15/2017  Future Appointments  Date Time Provider [place]  06/08/2018 10:00 AM Williemae Area, RN [id number]

## 2017-06-15 NOTE — Patient Instructions (Signed)
   Take a multivitamin every day when you are on Metformin.  Take Metformin 1000 mg 1/2 pill at dinner once daily for 2 weeks  Then, take Metformin 1000 mg 1 whole pill at dinner once daily for 2 weeks  Then, you may add the morning dose if tolerating.  If you have too much nausea or diarrhea, decrease your dose for 2 weeks and then try to go back up again.   WE CAN RECHECK YOUR A1C AND LIPID PANEL IN 3 MONTHS.

## 2017-07-20 LAB — POCT URINE QUALITATIVE DIPSTICK PROTEIN: PROTEIN UA: NEGATIVE

## 2017-07-20 LAB — HM DIABETES FOOT EXAM: HM DIABETIC FOOT EXAM: NORMAL

## 2017-08-24 ENCOUNTER — Encounter: Payer: Self-pay | Admitting: Physician Assistant

## 2017-08-24 LAB — POCT URINE QUALITATIVE DIPSTICK GLUCOSE: Glucose, UA: NEGATIVE

## 2017-09-21 ENCOUNTER — Encounter: Payer: Self-pay | Admitting: Physician Assistant

## 2017-09-21 ENCOUNTER — Ambulatory Visit: Payer: Medicare Other | Admitting: Physician Assistant

## 2017-09-21 VITALS — BP 138/80 | HR 103 | Temp 98.7°F | Wt 158.6 lb

## 2017-09-21 DIAGNOSIS — E785 Hyperlipidemia, unspecified: Secondary | ICD-10-CM | POA: Diagnosis not present

## 2017-09-21 DIAGNOSIS — I10 Essential (primary) hypertension: Secondary | ICD-10-CM | POA: Diagnosis not present

## 2017-09-21 DIAGNOSIS — E8881 Metabolic syndrome: Secondary | ICD-10-CM | POA: Diagnosis not present

## 2017-09-21 NOTE — Progress Notes (Signed)
Kenneth Salazar is a 72 y.o. male is here to discuss: Hypertension and Insulin Resistance  I acted as a Education administrator for [employer], PA-C Anselmo Pickler, LPN  History of Present Illness:   Chief Complaint  Patient presents with  . Follow-up  . Hypertension    average reading between 116/63-157/77 Since Jan 1st  . Insulin Resistance    average blood glucose range between 111-131     Pt here to follow up on insulin resistance, pt checking sugars every morning running on average 111 to 131. Pt had one episode when driving about a month ago where he felt very shaky and felt like he was going to pass out lasted about 15 seconds. No episode since. Pt watching diet doing low carbohydrate, no bread. Pt taking Metformin 1000 mg twice a day. Denies diarrhea tolerating medication.   Hypertension  This is a chronic problem. Episode onset: Blood pressure since Jan has been running lowest 116/63 and highest 157/77. The problem has been waxing and waning since onset. The problem is controlled. Pertinent negatives include no anxiety, blurred vision, chest pain, headaches, malaise/fatigue, neck pain, palpitations, peripheral edema or shortness of breath. (Feels like a band is wrapped around his head.) Risk factors for coronary artery disease include dyslipidemia (Insulin Resistance). There are no compliance problems.    He has lost about 10 lb since he was last here in December.  Wt Readings from Last 3 Encounters:  09/21/17 158 lb 9.6 oz (71.9 kg)  06/15/17 167 lb (75.8 kg)  06/10/17 166 lb 9.6 oz (75.6 kg)     Health Maintenance Due  Topic Date Due  . OPHTHALMOLOGY EXAM  07/14/1955    Past Medical History:  Diagnosis Date  . Arthritis   . Cancer [facility])    prostate 01-2015  . Hypertension      Social History   Socioeconomic History  . Marital status: Unknown    Spouse name: Not on file  . Number of children: Not on file  . Years of education: Not on file  . Highest education  level: Not on file  Social Needs  . Financial resource strain: Not on file  . Food insecurity - worry: Not on file  . Food insecurity - inability: Not on file  . Transportation needs - medical: Not on file  . Transportation needs - non-medical: Not on file  Occupational History  . Occupation: retired    Comment: maintenance via computer   Tobacco Use  . Smoking status: Never Smoker  . Smokeless tobacco: Never Used  Substance and Sexual Activity  . Alcohol use: Yes    Alcohol/week: 1.2 oz    Types: 1 Glasses of wine, 1 Cans of beer per week    Comment: 1 beer or wine/week  . Drug use: No  . Sexual activity: No  Other Topics Concern  . Not on file  Social History Narrative   Retired at age 54   Worked for telephone company   Married   3 children, 1 grandkid    Past Surgical History:  Procedure Laterality Date  . HERNIA REPAIR    . PROSTATE SURGERY     reomoved 01-2015  . VASECTOMY  1977    Family History  Problem Relation Age of Onset  . Diabetes Mother   . Stroke Father   . Diabetes Maternal Uncle     PMHx, SurgHx, SocialHx, FamHx, Medications, and Allergies were reviewed in the Visit Navigator and updated as appropriate.  Patient Active Problem List   Diagnosis Date Noted  . Insulin resistance 06/10/2017  . HLD (hyperlipidemia) 06/10/2017  . Overweight (BMI 25.0-29.9) 06/10/2017  . Primary osteoarthritis of left shoulder 06/10/2017  . Chronic hip pain, right 06/10/2017  . Arthralgia of both hands 06/10/2017  . Hypertension 02/10/2017  . Erectile dysfunction following radical prostatectomy 09/30/2015  . Male stress incontinence 09/30/2015  . Malignant neoplasm of prostate ([place]) 01/18/2015    Social History   Tobacco Use  . Smoking status: Never Smoker  . Smokeless tobacco: Never Used  Substance Use Topics  . Alcohol use: Yes    Alcohol/week: 1.2 oz    Types: 1 Glasses of wine, 1 Cans of beer per week    Comment: 1 beer or wine/week  . Drug use: No      Current Medications and Allergies:    Current Outpatient Medications:  .  aspirin 325 MG EC tablet, Take 325 mg by mouth every 6 (six) hours as needed for pain. , Disp: , Rfl:  .  aspirin EC 81 MG tablet, Take 81 mg by mouth daily. , Disp: , Rfl:  .  Inulin (FIBER CHOICE) 1.5 g CHEW, Chew 6 tablets by mouth daily. , Disp: , Rfl:  .  lisinopril (PRINIVIL,ZESTRIL) 10 MG tablet, Take 1 tablet (10 mg total) by mouth daily., Disp: 90 tablet, Rfl: 3 .  meclizine (ANTIVERT) 25 MG tablet, Take 1 tablet (25 mg total) by mouth 3 (three) times daily as needed for dizziness., Disp: 12 tablet, Rfl: 0 .  metFORMIN (GLUCOPHAGE) 1000 MG tablet, Take 1 tablet (1,000 mg total) by mouth 2 (two) times daily with a meal., Disp: 180 tablet, Rfl: 3 .  Multiple Vitamin (MULTIVITAMIN) tablet, Take 1 tablet by mouth daily. , Disp: , Rfl:  .  Multiple Vitamins-Minerals (OCULAR VITAMINS PO), Take 1 tablet by mouth daily., Disp: , Rfl:    Allergies  Allergen Reactions  . Sulfa Antibiotics Hives  . Antihistamines, Chlorpheniramine-Type Palpitations  . Antihistamines, Diphenhydramine-Type Palpitations  . Antihistamines, Loratadine-Type Palpitations  . Percocet [Oxycodone-Acetaminophen] Palpitations    Review of Systems   Review of Systems  Constitutional: Negative for malaise/fatigue.  Eyes: Negative for blurred vision.  Respiratory: Negative for shortness of breath.   Cardiovascular: Negative for chest pain and palpitations.  Musculoskeletal: Negative for neck pain.  Neurological: Negative for headaches.    Vitals:   Vitals:   09/21/17 1346  BP: 138/80  Pulse: (!) 103  Temp: 98.7 F (37.1 C)  TempSrc: Oral  SpO2: 97%  Weight: 158 lb 9.6 oz (71.9 kg)     Body mass index is 24.47 kg/m.   Physical Exam:    Physical Exam  Constitutional: He appears well-developed. He is cooperative.  Non-toxic appearance. He does not have a sickly appearance. He does not appear ill. No distress.   Cardiovascular: Normal rate, regular rhythm, S1 normal, S2 normal, normal heart sounds and normal pulses.  No LE edema  Pulmonary/Chest: Effort normal and breath sounds normal.  Neurological: He is alert. GCS eye subscore is 4. GCS verbal subscore is 5. GCS motor subscore is 6.  Skin: Skin is warm, dry and intact.  Psychiatric: He has a normal mood and affect. His speech is normal and behavior is normal.  Nursing note and vitals reviewed.    Assessment and Plan:    Brando was seen today for follow-up, hypertension and insulin resistance.  Diagnoses and all orders for this visit:  Essential hypertension Currently doing well on  10 mg lisinopril today. Continue this.  Hyperlipidemia Re-check lipids when fasting. Consider medication depending on results.  Insulin resistance Tolerating Metformin, has had weight loss with this and he reports that he has dramatically improved his diet. Re-check HgbA1c, will adjust medication based on results.   . Reviewed expectations re: course of current medical issues. . Discussed self-management of symptoms. . Outlined signs and symptoms indicating need for more acute intervention. . Patient verbalized understanding and all questions were answered. . See orders for this visit as documented in the electronic medical record. . Patient received an After Visit Summary.  CMA or LPN served as scribe during this visit. History, Physical, and Plan performed by medical provider. Documentation and orders reviewed and attested to.  [name], PA-C [place], [facility] 09/21/2017  Follow-up: No Follow-up on file.

## 2017-09-21 NOTE — Patient Instructions (Signed)
Return for fasting labs this week at your convenience. After midnight on the day of the lab draw, please do not eat anything. You may have water, black coffee, unsweetened tea.  We will adjust medications based on lab results.

## 2017-09-22 ENCOUNTER — Other Ambulatory Visit (INDEPENDENT_AMBULATORY_CARE_PROVIDER_SITE_OTHER): Payer: Medicare Other

## 2017-09-22 DIAGNOSIS — E8881 Metabolic syndrome: Secondary | ICD-10-CM

## 2017-09-22 DIAGNOSIS — E785 Hyperlipidemia, unspecified: Secondary | ICD-10-CM | POA: Diagnosis not present

## 2017-09-22 LAB — LIPID PANEL
CHOLESTEROL: 120 mg/dL (ref 0–200)
HDL: 60.7 mg/dL (ref >39.00)
LDL Cholesterol: 40 mg/dL (ref 0–99)
NonHDL: 59.46
Total CHOL/HDL Ratio: 2
Triglycerides: 99 mg/dL (ref 0.0–149.0)
VLDL: 19.8 mg/dL (ref 0.0–40.0)

## 2017-09-22 LAB — HEMOGLOBIN A1C: HEMOGLOBIN A1C: 6.2 % (ref 4.6–6.5)

## 2018-01-04 ENCOUNTER — Ambulatory Visit: Payer: Medicare Other | Admitting: Physician Assistant

## 2018-01-04 ENCOUNTER — Encounter: Payer: Self-pay | Admitting: Physician Assistant

## 2018-01-04 ENCOUNTER — Ambulatory Visit (INDEPENDENT_AMBULATORY_CARE_PROVIDER_SITE_OTHER): Payer: Medicare Other

## 2018-01-04 VITALS — BP 130/80 | HR 72 | Temp 98.0°F | Ht 67.5 in | Wt 148.4 lb

## 2018-01-04 DIAGNOSIS — I10 Essential (primary) hypertension: Secondary | ICD-10-CM | POA: Diagnosis not present

## 2018-01-04 DIAGNOSIS — M25551 Pain in right hip: Secondary | ICD-10-CM | POA: Diagnosis not present

## 2018-01-04 DIAGNOSIS — R634 Abnormal weight loss: Secondary | ICD-10-CM

## 2018-01-04 DIAGNOSIS — E785 Hyperlipidemia, unspecified: Secondary | ICD-10-CM | POA: Diagnosis not present

## 2018-01-04 DIAGNOSIS — E8881 Metabolic syndrome: Secondary | ICD-10-CM | POA: Diagnosis not present

## 2018-01-04 LAB — COMPREHENSIVE METABOLIC PANEL
ALK PHOS: 56 U/L (ref 39–117)
ALT: 14 U/L (ref 0–53)
AST: 15 U/L (ref 0–37)
Albumin: 4.4 g/dL (ref 3.5–5.2)
BILIRUBIN TOTAL: 0.9 mg/dL (ref 0.2–1.2)
BUN: 21 mg/dL (ref 6–23)
CO2: 32 mEq/L (ref 19–32)
CREATININE: 0.93 mg/dL (ref 0.40–1.50)
Calcium: 9.6 mg/dL (ref 8.4–10.5)
Chloride: 101 mEq/L (ref 96–112)
GFR: 84.31 mL/min (ref >60.00)
GLUCOSE: 119 mg/dL — AB (ref 70–99)
Potassium: 4.4 mEq/L (ref 3.5–5.1)
SODIUM: 140 meq/L (ref 135–145)
TOTAL PROTEIN: 6.7 g/dL (ref 6.0–8.3)

## 2018-01-04 LAB — CBC WITH DIFFERENTIAL/PLATELET
BASOS PCT: 0.4 % (ref 0.0–3.0)
Basophils Absolute: 0 10*3/uL (ref 0.0–0.1)
EOS PCT: 2.2 % (ref 0.0–5.0)
Eosinophils Absolute: 0.1 10*3/uL (ref 0.0–0.7)
HEMATOCRIT: 43.2 % (ref 39.0–52.0)
HEMOGLOBIN: 14.7 g/dL (ref 13.0–17.0)
Lymphocytes Relative: 30.2 % (ref 12.0–46.0)
Lymphs Abs: 1.4 10*3/uL (ref 0.7–4.0)
MCHC: 34 g/dL (ref 30.0–36.0)
MCV: 91 fl (ref 78.0–100.0)
MONO ABS: 0.5 10*3/uL (ref 0.1–1.0)
Monocytes Relative: 10.1 % (ref 3.0–12.0)
Neutro Abs: 2.6 10*3/uL (ref 1.4–7.7)
Neutrophils Relative %: 57.1 % (ref 43.0–77.0)
Platelets: 159 10*3/uL (ref 150.0–400.0)
RBC: 4.75 Mil/uL (ref 4.22–5.81)
RDW: 13.6 % (ref 11.5–15.5)
WBC: 4.6 10*3/uL (ref 4.0–10.5)

## 2018-01-04 LAB — LIPID PANEL
CHOL/HDL RATIO: 2
Cholesterol: 147 mg/dL (ref 0–200)
HDL: 61 mg/dL (ref >39.00)
LDL Cholesterol: 57 mg/dL (ref 0–99)
NonHDL: 86.25
TRIGLYCERIDES: 144 mg/dL (ref 0.0–149.0)
VLDL: 28.8 mg/dL (ref 0.0–40.0)

## 2018-01-04 LAB — T4, FREE: Free T4: 0.91 ng/dL (ref 0.60–1.60)

## 2018-01-04 LAB — TSH: TSH: 2.6 u[IU]/mL (ref 0.35–4.50)

## 2018-01-04 LAB — HEMOGLOBIN A1C: Hgb A1c MFr Bld: 6.1 % (ref 4.6–6.5)

## 2018-01-04 NOTE — Progress Notes (Signed)
Kenneth Salazar is a 72 y.o. male is here to discuss: Hypertension, Insulin Resistance and Hyperlipidemia.  I acted as a Education administrator for [employer], PA-C Anselmo Pickler, LPN  History of Present Illness:   Chief Complaint  Patient presents with  . Hypertension    Pt is Fasting  . Insulin Resistance  . Hyperlipidemia    Hypertension  This is a chronic problem. Episode onset: Pt has been checking blood pressure at home daily, pt stopped Lisinopril 5/21 due to blood pressure low and dizziness, Bp ranging 102/65-140/80 on 6/23 10:00PM. The problem has been rapidly improving since onset. The problem is controlled. Pertinent negatives include no blurred vision, chest pain, headaches, malaise/fatigue, palpitations, peripheral edema or shortness of breath. The current treatment provides significant improvement. There are no compliance problems.   Hyperlipidemia  This is a chronic problem. Pertinent negatives include no chest pain or shortness of breath. Current antihyperlipidemic treatment includes exercise and diet change. Compliance problems: Pt is trying to watch cholesterol with diet, walking everyday 1 mile.  Risk factors for coronary artery disease include hypertension and male sex.  Insulin Resistance Pt here for follow up, checking sugars every other day ranging from 107-131. Taking Metformin 1000 mg twice a day has some indigestion, which has been ongoing since starting metformin. Denies diarrhea. No hypoglycemic events.  Unintentional Weight Loss He has lost about 20 pounds over the past 7 months.  Some of this was unintentional however he has lost 10 pounds over the past 3 months which he feels was not intentional.  He states that his appetite is reduced.  He tries to eat at each meal but notices that his overall portions have reduced.  Denies blood in stool.  Breakfast: honey nut cheerios and blueberries, two oreo cookies, 1% milk Lunch: PB and J or [place] burger and pasta Dinner:  protein, veggies and a carb Drink: water  Hip Pain States that his R hip has been bothering him for a few months.  He reports that his hip has been hurting all the time no specific time a day.  He does not like to take medicine, and has taken an Advil only one time.  He does have a history of prostate cancer, states that his prostate was removed.  He has tried topical BenGay to the area as well.  He feels as though his gait has slightly changed.    Lab Results  Component Value Date   HGBA1C 6.2 09/22/2017      Wt Readings from Last 5 Encounters:  01/04/18 148 lb 6.1 oz (67.3 kg)  09/21/17 158 lb 9.6 oz (71.9 kg)  06/15/17 167 lb (75.8 kg)  06/10/17 166 lb 9.6 oz (75.6 kg)  06/07/17 167 lb 6.4 oz (75.9 kg)    There are no preventive care reminders to display for this patient.  Past Medical History:  Diagnosis Date  . Arthritis   . Cancer [facility])    prostate 01-2015  . Hypertension      Social History   Socioeconomic History  . Marital status: Unknown    Spouse name: Not on file  . Number of children: Not on file  . Years of education: Not on file  . Highest education level: Not on file  Occupational History  . Occupation: retired    Comment: maintenance via computer   Social Needs  . Financial resource strain: Not on file  . Food insecurity:    Worry: Not on file    Inability: Not  on file  . Transportation needs:    Medical: Not on file    Non-medical: Not on file  Tobacco Use  . Smoking status: Never Smoker  . Smokeless tobacco: Never Used  Substance and Sexual Activity  . Alcohol use: Yes    Alcohol/week: 1.2 oz    Types: 1 Glasses of wine, 1 Cans of beer per week    Comment: 1 beer or wine/week  . Drug use: No  . Sexual activity: Never  Lifestyle  . Physical activity:    Days per week: Not on file    Minutes per session: Not on file  . Stress: Not on file  Relationships  . Social connections:    Talks on phone: Not on file    Gets together: Not on  file    Attends religious service: Not on file    Active member of club or organization: Not on file    Attends meetings of clubs or organizations: Not on file    Relationship status: Not on file  . Intimate partner violence:    Fear of current or ex partner: Not on file    Emotionally abused: Not on file    Physically abused: Not on file    Forced sexual activity: Not on file  Other Topics Concern  . Not on file  Social History Narrative   Retired at age 51   Worked for telephone company   Married   3 children, 1 grandkid    Past Surgical History:  Procedure Laterality Date  . HERNIA REPAIR    . PROSTATE SURGERY     reomoved 01-2015  . VASECTOMY  1977    Family History  Problem Relation Age of Onset  . Diabetes Mother   . Stroke Father   . Diabetes Maternal Uncle     PMHx, SurgHx, SocialHx, FamHx, Medications, and Allergies were reviewed in the Visit Navigator and updated as appropriate.   Patient Active Problem List   Diagnosis Date Noted  . Insulin resistance 06/10/2017  . HLD (hyperlipidemia) 06/10/2017  . Overweight (BMI 25.0-29.9) 06/10/2017  . Primary osteoarthritis of left shoulder 06/10/2017  . Chronic hip pain, right 06/10/2017  . Arthralgia of both hands 06/10/2017  . Hypertension 02/10/2017  . Erectile dysfunction following radical prostatectomy 09/30/2015  . Male stress incontinence 09/30/2015  . Malignant neoplasm of prostate ([place]) 01/18/2015    Social History   Tobacco Use  . Smoking status: Never Smoker  . Smokeless tobacco: Never Used  Substance Use Topics  . Alcohol use: Yes    Alcohol/week: 1.2 oz    Types: 1 Glasses of wine, 1 Cans of beer per week    Comment: 1 beer or wine/week  . Drug use: No    Current Medications and Allergies:    Current Outpatient Medications:  .  aspirin 325 MG EC tablet, Take 325 mg by mouth every 6 (six) hours as needed for pain. , Disp: , Rfl:  .  aspirin EC 81 MG tablet, Take 81 mg by mouth daily. ,  Disp: , Rfl:  .  Inulin (FIBER CHOICE) 1.5 g CHEW, Chew 6 tablets by mouth daily. , Disp: , Rfl:  .  meclizine (ANTIVERT) 25 MG tablet, Take 1 tablet (25 mg total) by mouth 3 (three) times daily as needed for dizziness., Disp: 12 tablet, Rfl: 0 .  metFORMIN (GLUCOPHAGE) 1000 MG tablet, Take 1 tablet (1,000 mg total) by mouth 2 (two) times daily with a meal., Disp: 180 tablet,  Rfl: 3 .  Multiple Vitamin (MULTIVITAMIN) tablet, Take 1 tablet by mouth daily. , Disp: , Rfl:  .  Multiple Vitamins-Minerals (OCULAR VITAMINS PO), Take 1 tablet by mouth daily., Disp: , Rfl:  .  lisinopril (PRINIVIL,ZESTRIL) 10 MG tablet, Take 1 tablet (10 mg total) by mouth daily. (Patient not taking: Reported on 01/04/2018), Disp: 90 tablet, Rfl: 3   Allergies  Allergen Reactions  . Sulfa Antibiotics Hives  . Antihistamines, Chlorpheniramine-Type Palpitations  . Antihistamines, Diphenhydramine-Type Palpitations  . Antihistamines, Loratadine-Type Palpitations  . Percocet [Oxycodone-Acetaminophen] Palpitations    Review of Systems   Review of Systems  Constitutional: Negative for malaise/fatigue.  Eyes: Negative for blurred vision.  Respiratory: Negative for shortness of breath.   Cardiovascular: Negative for chest pain and palpitations.  Neurological: Negative for headaches.    Vitals:   Vitals:   01/04/18 0912  BP: 130/80  Pulse: 72  Temp: 98 F (36.7 C)  TempSrc: Oral  SpO2: 97%  Weight: 148 lb 6.1 oz (67.3 kg)  Height: 5' 7.5" (1.715 m)     Body mass index is 22.9 kg/m.   Physical Exam:    Physical Exam  Constitutional: He appears well-developed. He is cooperative.  Non-toxic appearance. He does not have a sickly appearance. He does not appear ill. No distress.  Cardiovascular: Normal rate, regular rhythm, S1 normal, S2 normal, normal heart sounds and normal pulses.  No LE edema  Pulmonary/Chest: Effort normal and breath sounds normal.  Musculoskeletal:  Tenderness with palpation to right  SI joint. Pain with passive flexion and extension on R leg. No bony tenderness to lumbar spine.   Neurological: He is alert. GCS eye subscore is 4. GCS verbal subscore is 5. GCS motor subscore is 6.  Skin: Skin is warm, dry and intact.  Psychiatric: He has a normal mood and affect. His speech is normal and behavior is normal.  Nursing note and vitals reviewed.   CLINICAL DATA:  Six months of right hip pain with no known injury. History of prostate malignancy status postprostatectomy.  EXAM: DG HIP (WITH OR WITHOUT PELVIS) 2-3V RIGHT  COMPARISON:  None in PACs  FINDINGS: The bones are subjectively adequately mineralized. There is no lytic nor blastic lesion of the pelvis or right hip. The right hip joint space is well maintained. The articular surfaces of the right femoral head and acetabulum remains smoothly rounded. The femoral neck, intertrochanteric, and subtrochanteric regions are normal.  IMPRESSION: There is no acute bony abnormality of the right hip nor evidence of significant degenerative change.   Electronically Signed   By: David  Martinique M.D.   On: 01/04/2018 14:05    Assessment and Plan:    Kenneth Salazar was seen today for hypertension, insulin resistance and hyperlipidemia.  Diagnoses and all orders for this visit:  Essential hypertension I agree with stopping lisinopril.  Continue to monitor blood pressure a few times a week.  Will resume lisinopril if needed.  Insulin resistance Will recheck a hemoglobin A1c today.  If we can, I would like to decrease metformin.  Will reevaluate based on labs.  Also I did recommend that he start Glucerna shakes to help with his weight and as a controlled carbohydrate snack. -     Hemoglobin A1c  Hyperlipidemia, unspecified hyperlipidemia type Recheck lipid panel today. -     Lipid panel  Right hip pain  Suspect muscle strain, however I would like to get an x-ray given chronicity of symptoms as well as history of cancer  and age.  R hip xray today is essentially normal --> will recommend further evaluation with Dr. Paulla Fore in sports medicine or Lauren in physical therapy here in our office.  Will determine based on x-ray result and shared decision-making with patient. -     DG HIP UNILAT W OR W/O PELVIS 2-3 VIEWS RIGHT; Future  Weight loss Follow-up in 1 month for a weight check.  We discussed starting Glucerna shakes daily not to replace meals but to provide additional calories and protein.  Also will check routine labs as well as a thyroid and CBC for additional evaluation. -     Comprehensive metabolic panel -     TSH -     T4, free -     CBC with Differential/Platelet   . Reviewed expectations re: course of current medical issues. . Discussed self-management of symptoms. . Outlined signs and symptoms indicating need for more acute intervention. . Patient verbalized understanding and all questions were answered. . See orders for this visit as documented in the electronic medical record. . Patient received an After Visit Summary.  CMA or LPN served as scribe during this visit. History, Physical, and Plan performed by medical provider. Documentation and orders reviewed and attested to.   [name], PA-C [place] 01/04/2018  Follow-up: Return in about 1 month (around 02/03/2018) for unintentional weight loss.

## 2018-01-04 NOTE — Patient Instructions (Signed)
It was great to see you.  Keep an eye on blood pressure - if consistently >140/90, please let me know.  We will contact you with your lab results.  Consider Glucerna Shakes daily. This is not to replace a meal, should be in addition to or as snack.

## 2018-01-05 ENCOUNTER — Other Ambulatory Visit: Payer: Self-pay | Admitting: *Deleted

## 2018-01-05 ENCOUNTER — Other Ambulatory Visit: Payer: Self-pay | Admitting: Physician Assistant

## 2018-01-05 MED ORDER — ATORVASTATIN CALCIUM 10 MG PO TABS: 10.0000 mg | ORAL_TABLET | Freq: Every day | ORAL | 1 refills | Status: DC

## 2018-01-05 MED ORDER — METFORMIN HCL ER 750 MG PO TB24: 1500.0000 mg | ORAL_TABLET | Freq: Every day | ORAL | 1 refills | Status: DC

## 2018-01-26 ENCOUNTER — Ambulatory Visit: Payer: Medicare Other | Admitting: Physician Assistant

## 2018-01-26 ENCOUNTER — Encounter: Payer: Self-pay | Admitting: Physician Assistant

## 2018-01-26 VITALS — BP 154/80 | HR 80 | Temp 97.9°F | Ht 67.5 in | Wt 149.4 lb

## 2018-01-26 DIAGNOSIS — R779 Abnormality of plasma protein, unspecified: Secondary | ICD-10-CM

## 2018-01-26 DIAGNOSIS — E8809 Other disorders of plasma-protein metabolism, not elsewhere classified: Secondary | ICD-10-CM | POA: Diagnosis not present

## 2018-01-26 DIAGNOSIS — E8881 Metabolic syndrome: Secondary | ICD-10-CM | POA: Diagnosis not present

## 2018-01-26 DIAGNOSIS — R634 Abnormal weight loss: Secondary | ICD-10-CM

## 2018-01-26 NOTE — Progress Notes (Addendum)
Kenneth Salazar is a 72 y.o. male is here to follow up on weight loss.  I acted as a Education administrator for [employer], PA-C [employer], LPN  History of Present Illness:   Chief Complaint  Patient presents with  . unintentional weight loss    HPI  Unintentional Weight loss Pt is here to follow up on unintentional weight loss. Pt has gained 1 pound 6.1 oz since last visit a month ago.  Patient has been more intentional about making sure that he eats more tolerance to meals and snacks.  He is drinking 1 Glucerna daily.  He weighs himself approximately once a week.  Denies any polyphagia or polyuria.  Continues to check his blood sugars regularly at home, brings a report today, fasting blood sugars in the morning are ranging from 10 6-1 31 on a regular basis.  He is taking his medications as prescribed.  We completed lab work last visit and labs were essentially normal.  Patient denies any new or unusual symptoms, denies: Chest pain, shortness of breath, night sweats, chills, fever, blood in stools.  He does check his blood pressure on a regular basis, blood pressures at home have been on average 125/75. Patient denies chest pain, SOB, blurred vision, dizziness, unusual headaches, lower leg swelling. Denies excessive caffeine intake, stimulant usage, excessive alcohol intake, or increase in salt consumption.   There are no preventive care reminders to display for this patient.  Past Medical History:  Diagnosis Date  . Arthritis   . Cancer [facility])    prostate 01-2015  . Hypertension      Social History   Socioeconomic History  . Marital status: Unknown    Spouse name: Not on file  . Number of children: Not on file  . Years of education: Not on file  . Highest education level: Not on file  Occupational History  . Occupation: retired    Comment: maintenance via computer   Social Needs  . Financial resource strain: Not on file  . Food insecurity:    Worry: Not on file    Inability:  Not on file  . Transportation needs:    Medical: Not on file    Non-medical: Not on file  Tobacco Use  . Smoking status: Never Smoker  . Smokeless tobacco: Never Used  Substance and Sexual Activity  . Alcohol use: Yes    Alcohol/week: 1.2 oz    Types: 1 Glasses of wine, 1 Cans of beer per week    Comment: 1 beer or wine/week  . Drug use: No  . Sexual activity: Never  Lifestyle  . Physical activity:    Days per week: Not on file    Minutes per session: Not on file  . Stress: Not on file  Relationships  . Social connections:    Talks on phone: Not on file    Gets together: Not on file    Attends religious service: Not on file    Active member of club or organization: Not on file    Attends meetings of clubs or organizations: Not on file    Relationship status: Not on file  . Intimate partner violence:    Fear of current or ex partner: Not on file    Emotionally abused: Not on file    Physically abused: Not on file    Forced sexual activity: Not on file  Other Topics Concern  . 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 HERNIA REPAIR    . PROSTATE SURGERY     reomoved 01-2015  . VASECTOMY  1977    Family History  Problem Relation Age of Onset  . Diabetes Mother   . Stroke Father   . Diabetes Maternal Uncle     PMHx, SurgHx, SocialHx, FamHx, Medications, and Allergies were reviewed in the Visit Navigator and updated as appropriate.   Patient Active Problem List   Diagnosis Date Noted  . Insulin resistance 06/10/2017  . HLD (hyperlipidemia) 06/10/2017  . Primary osteoarthritis of left shoulder 06/10/2017  . Chronic hip pain, right 06/10/2017  . Arthralgia of both hands 06/10/2017  . Erectile dysfunction following radical prostatectomy 09/30/2015  . Male stress incontinence 09/30/2015  . Malignant neoplasm of prostate  ([place]) 01/18/2015    Social History   Tobacco Use  . Smoking status: Never Smoker  . Smokeless tobacco: Never Used  Substance Use Topics  . Alcohol use: Yes    Alcohol/week: 1.2 oz    Types: 1 Glasses of wine, 1 Cans of beer per week    Comment: 1 beer or wine/week  . Drug use: No    Current Medications and Allergies:    Current Outpatient Medications:  .  aspirin 325 MG EC tablet, Take 325 mg by mouth every 6 (six) hours as needed for pain. , Disp: , Rfl:  .  aspirin EC 81 MG tablet, Take 81 mg by mouth daily. , Disp: , Rfl:  .  atorvastatin (LIPITOR) 10 MG tablet, Take 1 tablet (10 mg total) by mouth daily., Disp: 90 tablet, Rfl: 1 .  Inulin (FIBER CHOICE) 1.5 g CHEW, Chew 6 tablets by mouth daily. , Disp: , Rfl:  .  meclizine (ANTIVERT) 25 MG tablet, Take 1 tablet (25 mg total) by mouth 3 (three) times daily as needed for dizziness., Disp: 12 tablet, Rfl: 0 .  metFORMIN (GLUCOPHAGE-XR) 750 MG 24 hr tablet, Take 2 tablets (1,500 mg total) by mouth daily with breakfast., Disp: 180 tablet, Rfl: 1 .  Multiple Vitamin (MULTIVITAMIN) tablet, Take 1 tablet by mouth daily. , Disp: , Rfl:  .  Multiple Vitamins-Minerals (OCULAR VITAMINS PO), Take 1 tablet by mouth daily., Disp: , Rfl:  .  lisinopril (PRINIVIL,ZESTRIL) 10 MG tablet, Take 1 tablet (10 mg total) by mouth daily. (Patient not taking: Reported on 01/04/2018), Disp: 90 tablet, Rfl: 3   Allergies  Allergen Reactions  . Sulfa Antibiotics Hives  . Antihistamines, Chlorpheniramine-Type Palpitations  . Antihistamines, Diphenhydramine-Type Palpitations  . Antihistamines, Loratadine-Type Palpitations  . Percocet [Oxycodone-Acetaminophen] Palpitations    Review of Systems   ROS  Negative unless otherwise specified per HPI.  Vitals:   Vitals:   01/26/18 0858 01/26/18 0914  BP: (!) 158/80 (!) 154/80  Pulse: 76 80  Temp: 97.9 F (36.6 C)   TempSrc: Oral   SpO2: 97%   Weight: 149 lb 6.1 oz (67.8 kg)   Height: 5' 7.5"  (1.715 m)      Body mass index is 23.05 kg/m.   Physical Exam:    Physical Exam  Constitutional: He appears well-developed. He is cooperative.  Non-toxic appearance. He does not have a sickly appearance. He does not appear ill. No distress.  Cardiovascular: Normal rate, regular rhythm, S1 normal, S2 normal, normal heart sounds and normal pulses.  No LE edema  Pulmonary/Chest: Effort normal and  breath sounds normal.  Neurological: He is alert. GCS eye subscore is 4. GCS verbal subscore is 5. GCS motor subscore is 6.  Skin: Skin is warm, dry and intact.  Psychiatric: He has a normal mood and affect. His speech is normal and behavior is normal.  Nursing note and vitals reviewed.    Assessment and Plan:    Kenneth Salazar was seen today for unintentional weight loss.  Diagnoses and all orders for this visit:  Weight loss Weight is currently stable.  Discussed continuing his current regimen of 1 Glucerna shake daily as well as making sure he gets adequate protein at each meal.  Patient verbalized understanding and is agreeable to plan.  I recommended that he check his weight roughly once a week at home, and let us know if his weight begins to drop again.  Otherwise we will see him for his regular scheduled physical exam in December.  Elevated blood pressure He remains off his lisinopril.  He reports that his blood pressure is elevated today because he witnessed an almost accident on the way here.  Blood pressures at home are very well controlled.  He is going to check his blood pressure at home and let us know if it has not improved by the time he has gotten home.  No red flags on exam today.  Insulin resistance Doing well with CBGs.  He is compliant with medication.  Continue current regimen at this time.   . Reviewed expectations re: course of current medical issues. . Discussed self-management of symptoms. . Outlined signs and symptoms indicating need for more acute  intervention. . Patient verbalized understanding and all questions were answered. . See orders for this visit as documented in the electronic medical record. . Patient received an After Visit Summary.  CMA or LPN served as scribe during this visit. History, Physical, and Plan performed by medical provider. Documentation and orders reviewed and attested to.  [name], PA-C [place], [place] 01/26/2018  Follow-up: No follow-ups on file.

## 2018-05-11 ENCOUNTER — Other Ambulatory Visit: Payer: Self-pay | Admitting: Physician Assistant

## 2018-06-08 ENCOUNTER — Ambulatory Visit: Payer: 59 | Admitting: *Deleted

## 2018-06-14 ENCOUNTER — Ambulatory Visit (INDEPENDENT_AMBULATORY_CARE_PROVIDER_SITE_OTHER): Payer: Medicare Other | Admitting: Physician Assistant

## 2018-06-14 ENCOUNTER — Ambulatory Visit (INDEPENDENT_AMBULATORY_CARE_PROVIDER_SITE_OTHER): Payer: Medicare Other | Admitting: *Deleted

## 2018-06-14 ENCOUNTER — Encounter: Payer: Self-pay | Admitting: Physician Assistant

## 2018-06-14 VITALS — BP 156/80 | HR 80 | Temp 98.2°F | Resp 16 | Ht 67.5 in | Wt 153.8 lb

## 2018-06-14 DIAGNOSIS — E785 Hyperlipidemia, unspecified: Secondary | ICD-10-CM | POA: Diagnosis not present

## 2018-06-14 DIAGNOSIS — I10 Essential (primary) hypertension: Secondary | ICD-10-CM

## 2018-06-14 DIAGNOSIS — Z1331 Encounter for screening for depression: Secondary | ICD-10-CM | POA: Diagnosis not present

## 2018-06-14 DIAGNOSIS — Z0000 Encounter for general adult medical examination without abnormal findings: Secondary | ICD-10-CM

## 2018-06-14 DIAGNOSIS — Z23 Encounter for immunization: Secondary | ICD-10-CM

## 2018-06-14 DIAGNOSIS — E8881 Metabolic syndrome: Secondary | ICD-10-CM | POA: Diagnosis not present

## 2018-06-14 DIAGNOSIS — Z8546 Personal history of malignant neoplasm of prostate: Secondary | ICD-10-CM

## 2018-06-14 NOTE — Patient Instructions (Signed)
It was great to see you!  Please make an appointment with the lab on your way out. I would like for you to return for lab work within 1-2 weeks. After midnight on the day of the lab draw, please do not eat anything. You may have water, black coffee, unsweetened tea.   Health Maintenance, Male A healthy lifestyle and preventive care is important for your health and wellness. Ask your health care provider about what schedule of regular examinations is right for you. What should I know about weight and diet? Eat a Healthy Diet  Eat plenty of vegetables, fruits, whole grains, low-fat dairy products, and lean protein.  Do not eat a lot of foods high in solid fats, added sugars, or salt.  Maintain a Healthy Weight Regular exercise can help you achieve or maintain a healthy weight. You should:  Do at least 150 minutes of exercise each week. The exercise should increase your heart rate and make you sweat (moderate-intensity exercise).  Do strength-training exercises at least twice a week.  Watch Your Levels of Cholesterol and Blood Lipids  Have your blood tested for lipids and cholesterol every 5 years starting at 74 years of age. If you are at high risk for heart disease, you should start having your blood tested when you are 74 years old. You may need to have your cholesterol levels checked more often if: ? Your lipid or cholesterol levels are high. ? You are older than 74 years of age. ? You are at high risk for heart disease.  What should I know about cancer screening? Many types of cancers can be detected early and may often be prevented. Lung Cancer  You should be screened every year for lung cancer if: ? You are a current smoker who has smoked for at least 30 years. ? You are a former smoker who has quit within the past 15 years.  Talk to your health care provider about your screening options, when you should start screening, and how often you should be screened.  Colorectal  Cancer  Routine colorectal cancer screening usually begins at 74 years of age and should be repeated every 5-10 years until you are 75 years old. You may need to be screened more often if early forms of precancerous polyps or small growths are found. Your health care provider may recommend screening at an earlier age if you have risk factors for colon cancer.  Your health care provider may recommend using home test kits to check for hidden blood in the stool.  A small camera at the end of a tube can be used to examine your colon (sigmoidoscopy or colonoscopy). This checks for the earliest forms of colorectal cancer.  Prostate and Testicular Cancer  Depending on your age and overall health, your health care provider may do certain tests to screen for prostate and testicular cancer.  Talk to your health care provider about any symptoms or concerns you have about testicular or prostate cancer.  Skin Cancer  Check your skin from head to toe regularly.  Tell your health care provider about any new moles or changes in moles, especially if: ? There is a change in a mole's size, shape, or color. ? You have a mole that is larger than a pencil eraser.  Always use sunscreen. Apply sunscreen liberally and repeat throughout the day.  Protect yourself by wearing long sleeves, pants, a wide-brimmed hat, and sunglasses when outside.  What should I know about heart disease, diabetes,  and high blood pressure?  If you are 24-93 years of age, have your blood pressure checked every 3-5 years. If you are 68 years of age or older, have your blood pressure checked every year. You should have your blood pressure measured twice-once when you are at a hospital or clinic, and once when you are not at a hospital or clinic. Record the average of the two measurements. To check your blood pressure when you are not at a hospital or clinic, you can use: ? An automated blood pressure machine at a pharmacy. ? A home blood  pressure monitor.  Talk to your health care provider about your target blood pressure.  If you are between 67-58 years old, ask your health care provider if you should take aspirin to prevent heart disease.  Have regular diabetes screenings by checking your fasting blood sugar level. ? If you are at a normal weight and have a low risk for diabetes, have this test once every three years after the age of 60. ? If you are overweight and have a high risk for diabetes, consider being tested at a younger age or more often.  A one-time screening for abdominal aortic aneurysm (AAA) by ultrasound is recommended for men aged 27-75 years who are current or former smokers. What should I know about preventing infection? Hepatitis B If you have a higher risk for hepatitis B, you should be screened for this virus. Talk with your health care provider to find out if you are at risk for hepatitis B infection. Hepatitis C Blood testing is recommended for:  Everyone born from [birth year] through 1967.  Anyone with known risk factors for hepatitis C.  Sexually Transmitted Diseases (STDs)  You should be screened each year for STDs including gonorrhea and chlamydia if: ? You are sexually active and are younger than 74 years of age. ? You are older than 74 years of age and your health care provider tells you that you are at risk for this type of infection. ? Your sexual activity has changed since you were last screened and you are at an increased risk for chlamydia or gonorrhea. Ask your health care provider if you are at risk.  Talk with your health care provider about whether you are at high risk of being infected with HIV. Your health care provider may recommend a prescription medicine to help prevent HIV infection.  What else can I do?  Schedule regular health, dental, and eye exams.  Stay current with your vaccines (immunizations).  Do not use any tobacco products, such as cigarettes, chewing tobacco, and  e-cigarettes. If you need help quitting, ask your health care provider.  Limit alcohol intake to no more than 2 drinks per day. One drink equals 12 ounces of beer, 5 ounces of wine, or 1 ounces of hard liquor.  Do not use street drugs.  Do not share needles.  Ask your health care provider for help if you need support or information about quitting drugs.  Tell your health care provider if you often feel depressed.  Tell your health care provider if you have ever been abused or do not feel safe at home. This information is not intended to replace advice given to you by your health care provider. Make sure you discuss any questions you have with your health care provider. Document Released: 12/26/2007 Document Revised: 02/26/2016 Document Reviewed: 04/02/2015 Elsevier Interactive Patient Education  [employer].

## 2018-06-14 NOTE — Progress Notes (Addendum)
Subjective:   Kenneth Salazar is a 72 y.o. male who presents for Medicare Annual/Subsequent preventive examination.  Lives in two story home with wife and two story home.   Review of Systems:  No ROS.  Medicare Wellness Visit. Additional risk factors are reflected in the social history.  Sleep on and off for about 8 hrs. Gets up to use restroom several times per night. Naps almost every day.   Cardiac Risk Factors include: advanced age (>23men, >46 women);hypertension;male gender     Objective:    Vitals: BP (!) 156/80 (BP Location: Left Arm, Patient Position: Sitting, Cuff Size: Normal)   Pulse 80   Temp 98.2 F (36.8 C) (Oral)   Resp 16   Ht 5' 7.5" (1.715 m)   Wt 153 lb 12.8 oz (69.8 kg)   SpO2 98%   BMI 23.73 kg/m   Body mass index is 23.73 kg/m. Patient states his BP is always high at the doctors office. He has a list of BP readings from home.  Advanced Directives 06/14/2018 06/07/2017 01/14/2016  Does Patient Have a Medical Advance Directive? Yes Yes Yes  Type of Paramedic of [place];Living will [place];Living will [place];Living will;Out of facility DNR (pink MOST or yellow form)  Does patient want to make changes to medical advance directive? No - Patient declined No - Patient declined No - Patient declined  Copy of [place] in Chart? Yes - validated most recent copy scanned in chart (See row information) Yes No - copy requested    Tobacco Social History   Tobacco Use  Smoking Status Never Smoker  Smokeless Tobacco Never Used     Counseling given: Not Answered   Past Medical History:  Diagnosis Date  . Arthritis   . Cancer [facility])    prostate 01-2015  . Hypertension    Past Surgical History:  Procedure Laterality Date  . HERNIA REPAIR    . PROSTATE SURGERY     reomoved 01-2015  . VASECTOMY  1977   Family History  Problem Relation Age of Onset  . Diabetes Mother     . Stroke Father   . Diabetes Maternal Uncle    Social History   Socioeconomic History  . Marital status: Married    Spouse name: Not on file  . Number of children: Not on file  . Years of education: Not on file  . Highest education level: Not on file  Occupational History  . Occupation: retired    Comment: maintenance via computer   Social Needs  . Financial resource strain: Not on file  . Food insecurity:    Worry: Not on file    Inability: Not on file  . Transportation needs:    Medical: Not on file    Non-medical: Not on file  Tobacco Use  . Smoking status: Never Smoker  . Smokeless tobacco: Never Used  Substance and Sexual Activity  . Alcohol use: Yes    Alcohol/week: 2.0 standard drinks    Types: 1 Glasses of wine, 1 Cans of beer per week    Comment: 1 beer or wine/week  . Drug use: No  . Sexual activity: Never  Lifestyle  . Physical activity:    Days per week: Not on file    Minutes per session: Not on file  . Stress: Not on file  Relationships  . Social connections:    Talks on phone: Not on file  Gets together: Not on file    Attends religious service: Not on file    Active member of club or organization: Not on file    Attends meetings of clubs or organizations: Not on file    Relationship status: Not on file  Other Topics Concern  . 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 aspirin EC 81 MG tablet Take 81 mg by mouth daily.   Marland Kitchen atorvastatin (LIPITOR) 10 MG tablet TAKE 1 TABLET BY MOUTH  DAILY  . Inulin (FIBER CHOICE) 1.5 g CHEW Chew 6 tablets by mouth daily.   . meclizine (ANTIVERT) 25 MG tablet Take 1 tablet (25 mg total) by mouth 3 (three) times daily as needed for dizziness.  . metFORMIN (GLUCOPHAGE-XR) 750 MG 24 hr tablet TAKE 2 TABLETS BY MOUTH  DAILY WITH BREAKFAST  . Multiple Vitamin  (MULTIVITAMIN) tablet Take 1 tablet by mouth daily.   . Multiple Vitamins-Minerals (OCULAR VITAMINS PO) Take 1 tablet by mouth daily.  Marland Kitchen aspirin 325 MG EC tablet Take 325 mg by mouth every 6 (six) hours as needed for pain.   Marland Kitchen lisinopril (PRINIVIL,ZESTRIL) 10 MG tablet Take 1 tablet (10 mg total) by mouth daily. (Patient not taking: Reported on 01/04/2018)   No facility-administered encounter medications on file as of 06/14/2018.     Activities of Daily Living In your present state of health, do you have any difficulty performing the following activities: 06/14/2018  Hearing? N  Vision? N  Difficulty concentrating or making decisions? N  Walking or climbing stairs? N  Dressing or bathing? N  Doing errands, shopping? N  Preparing Food and eating ? N  Using the Toilet? N  In the past six months, have you accidently leaked urine? N  Do you have problems with loss of bowel control? N  Managing your Medications? N  Managing your Finances? N  Housekeeping or managing your Housekeeping? N  Some recent data might be hidden    Patient Care Team: [name], [place] as PCP - General (Physician Assistant) Gastroenterology, Sadie Haber as Consulting Physician (Gastroenterology) Juluis Rainier as Consulting Physician (Optometry) Dentistry, [facility] as Consulting Physician (Dentistry)    Assessment:   This is a routine wellness examination for Kenneth Salazar.  Exercise Activities and Dietary recommendations Current Exercise Habits: Home exercise routine, Type of exercise: walking;strength training/weights;stretching, Time (Minutes): > 60, Frequency (Times/Week): 7, Weekly Exercise (Minutes/Week): 0, Intensity: Moderate, Exercise limited by: None identified  Eats 3 meals/day. Eats out 4 meals/week. 2 cups coffee, unsweet tea when eating out, at least 3 glasses water/day, gatorade zero when working in yard.  Breakfast: cereal with blueberries and two oreo cookies  Lunch: leftovers from  dinner Snack: Glucerna daily. After discussing patient is not sure if he has the low carb glucerna at home. I provided samples of the low card Ensure for him to discuss with provider at todays office visit. Dinner: beef stew, meatloaf, sloppy joes, chicken or hamburger, [place] burgers.  Occassional ice cream or cake.   Goals    . Maintain current health status, not gain weight.       Fall Risk Fall Risk  06/14/2018 06/07/2017 02/10/2017  Falls in the past year? 0 No No   Depression Screen PHQ 2/9 Scores 06/14/2018 06/10/2017 06/07/2017 02/10/2017  PHQ - 2  Score 0 0 0 0  PHQ- 9 Score 0 3 0 -  PHQ2 and PHQ9 completed. No signs or symptoms of depression. Patient exercises daily and works in the yard. Lives with wife and son. Total time spent on topic was 9 minutes.  Cognitive Function MMSE - Mini Mental State Exam 06/07/2017  Orientation to time 5  Orientation to Place 5  Registration 3  Attention/ Calculation 5  Recall 3  Language- name 2 objects 2  Language- repeat 1  Language- follow 3 step command 3  Language- read & follow direction 1  Write a sentence 1  Copy design 1  Total score 30  Ad8 score reviewed for issues:  Issues making decisions:no  Less interest in hobbies / activities:no  Repeats questions, stories (family complaining):no  Trouble using ordinary gadgets (microwave, computer, phone):no  Forgets the month or year: no  Mismanaging finances: no  Remembering appts:no  Daily problems with thinking and/or memory:no Ad8 score is=0 Patient does word searches.        Immunization History  Administered Date(s) Administered  . Influenza, High Dose Seasonal PF 06/07/2017, 04/14/2018  . Influenza-Unspecified 04/21/2016  . Pneumococcal Conjugate-13 05/18/2014  . Pneumococcal Polysaccharide-23 01/29/2009  . Td 01/17/2008  . Zoster 01/17/2008   Screening Tests Health Maintenance  Topic Date Due  . TETANUS/TDAP  01/27/2019 (Originally 01/16/2018)  .  HEMOGLOBIN A1C  07/06/2018  . FOOT EXAM  07/20/2018  . OPHTHALMOLOGY EXAM  04/23/2019  . COLONOSCOPY  07/24/2020  . INFLUENZA VACCINE  Completed  . Hepatitis C Screening  Completed  . PNA vac Low Risk Adult  Completed       Plan:   Follow up with PCP as directed.  I have personally reviewed and noted the following in the patient's chart:   . Medical and social history . Use of alcohol, tobacco or illicit drugs  . Current medications and supplements . Functional ability and status . Nutritional status . Physical activity . Advanced directives . List of other physicians . Vitals . Screenings to include cognitive, depression, and falls . Referrals and appointments  In addition, I have reviewed and discussed with patient certain preventive protocols, quality metrics, and best practice recommendations. A written personalized care plan for preventive services as well as general preventive health recommendations were provided to patient.     Williemae Area, RN  06/14/2018   I have reviewed documentation for AWV and Advance Care planning provided by [facility], I agree with documentation, I was immediately available for any questions. [name], [place]

## 2018-06-14 NOTE — Progress Notes (Signed)
PCP notes:   Health maintenance: Up to date.    Abnormal screenings: Blood pressure elevated. Patient states his BP is always high at doctors office. He brought a list of blood pressure readings with him. Patient also brought a list of glucose readings.    Patient concerns: None.   Nurse concerns: None.   Next PCP appt: Same day.

## 2018-06-14 NOTE — Progress Notes (Signed)
Subjective:    Kenneth Salazar is a 72 y.o. male and is here for a comprehensive physical exam.   HPI  There are no preventive care reminders to display for this patient.  Acute Concerns: None  Chronic Issues: HTN -- Currently not taking any medication. At home blood pressure readings are on average 125/80. Patient denies chest pain, SOB, blurred vision, dizziness, unusual headaches, lower leg swelling. Denies excessive caffeine intake, stimulant usage, excessive alcohol intake, or increase in salt consumption. Insulin resistance -- continues on Metformin 1500 mg XR daily, blood sugars are overall well controlled and less than 130 HLD --currently on Lipitor 10 mg.  Tolerating well, denies myalgias. Hx prostate cancer --he continues to have frequent urination, but states that this is been the norm throughout his entire life.  He also states that he has been to for urologist and none of them can explain why he has such frequent urination.  He would like his PSA checked today.   Wt Readings from Last 5 Encounters:  06/14/18 153 lb 12.8 oz (69.8 kg)  06/14/18 153 lb 12.8 oz (69.8 kg)  01/26/18 149 lb 6.1 oz (67.8 kg)  01/04/18 148 lb 6.1 oz (67.3 kg)  09/21/17 158 lb 9.6 oz (71.9 kg)   Health Maintenance: Immunizations --Tdap today Colonoscopy --needs repeat of colonoscopy in 2022 PSA --would like PSA checked today Diet --overall eats well, continues to eat sweets but eats moderate portions Sleep habits --overall sleeps well but continues to have to get up 4 times at night to use the bathroom Exercise --walks regularly Weight -- Weight: 153 lb 12.8 oz (69.8 kg)  Recent weight history Wt Readings from Last 10 Encounters:  06/14/18 153 lb 12.8 oz (69.8 kg)  06/14/18 153 lb 12.8 oz (69.8 kg)  01/26/18 149 lb 6.1 oz (67.8 kg)  01/04/18 148 lb 6.1 oz (67.3 kg)  09/21/17 158 lb 9.6 oz (71.9 kg)  06/15/17 167 lb (75.8 kg)  06/10/17 166 lb 9.6 oz (75.6 kg)  06/07/17 167 lb 6.4 oz  (75.9 kg)  02/10/17 167 lb (75.8 kg)  01/14/16 163 lb (73.9 kg)    Depression screen PHQ 2/9 06/14/2018  Decreased Interest 0  Down, Depressed, Hopeless 0  PHQ - 2 Score 0  Altered sleeping 0  Tired, decreased energy 0  Change in appetite 0  Feeling bad or failure about yourself  0  Trouble concentrating 0  Moving slowly or fidgety/restless 0  Suicidal thoughts 0  PHQ-9 Score 0  Difficult doing work/chores Not difficult at all    Other providers/specialists: Patient Care Team: [name], PA as PCP - General (Physician Assistant) Gastroenterology, Sadie Haber as Consulting Physician (Gastroenterology) Juluis Rainier as Consulting Physician (Optometry) Dentistry, [facility] as Consulting Physician (Dentistry)     PMHx, SurgHx, SocialHx, Medications, and Allergies were reviewed in the Visit Navigator and updated as appropriate.   Past Medical History:  Diagnosis Date  . Arthritis   . Cancer [facility])    prostate 01-2015  . Hypertension      Past Surgical History:  Procedure Laterality Date  . HERNIA REPAIR    . PROSTATE SURGERY     reomoved 01-2015  . VASECTOMY  1977     Family History  Problem Relation Age of Onset  . Diabetes Mother   . Stroke Father   . Diabetes Maternal Uncle     Social History   Tobacco Use  . Smoking status: Never Smoker  . Smokeless tobacco: Never  Used  Substance Use Topics  . Alcohol use: Yes    Alcohol/week: 2.0 standard drinks    Types: 1 Glasses of wine, 1 Cans of beer per week    Comment: 1 beer or wine/week  . Drug use: No    Review of Systems:   Review of Systems  Constitutional: Negative for chills, fever, malaise/fatigue and weight loss.  HENT: Negative for hearing loss, sinus pain and sore throat.   Respiratory: Negative for cough and hemoptysis.   Cardiovascular: Negative for chest pain, palpitations, leg swelling and PND.  Gastrointestinal: Negative for abdominal pain, constipation, diarrhea, heartburn,  nausea and vomiting.  Genitourinary: Positive for frequency. Negative for dysuria and urgency.  Musculoskeletal: Negative for back pain, myalgias and neck pain.  Skin: Negative for itching and rash.  Neurological: Negative for dizziness, tingling, seizures and headaches.  Endo/Heme/Allergies: Negative for polydipsia.  Psychiatric/Behavioral: Negative for depression. The patient is not nervous/anxious.     Objective:    Vitals:   06/14/18 1358  BP: (!) 156/80  Pulse: 80  Resp: 16  Temp: 98.2 F (36.8 C)  SpO2: 97%   Body mass index is 23.73 kg/m.  General  Alert, cooperative, no distress, appears stated age  Head:  Normocephalic, without obvious abnormality, atraumatic  Eyes:  PERRL, conjunctiva/corneas clear, EOM's intact, fundi benign, both eyes       Ears:  Normal TM's and external ear canals, both ears  Nose: Nares normal, septum midline, mucosa normal, no drainage or sinus tenderness  Throat: Lips, mucosa, and tongue normal; teeth and gums normal  Neck: Supple, symmetrical, trachea midline, no adenopathy;     thyroid:  No enlargement/tenderness/nodules; no carotid bruit or JVD  Back:   Symmetric, no curvature, ROM normal, no CVA tenderness  Lungs:   Clear to auscultation bilaterally, respirations unlabored  Chest wall:  No tenderness or deformity  Heart:  Regular rate and rhythm, S1 and S2 normal, no murmur, rub or gallop  Abdomen:   Soft, non-tender, bowel sounds active all four quadrants, no masses, no organomegaly  Extremities: Extremities normal, atraumatic, no cyanosis or edema  Prostate : Not done   Skin: Skin color, texture, turgor normal, no rashes or lesions  Lymph nodes: Cervical, supraclavicular, and axillary nodes normal  Neurologic: CNII-XII grossly intact. Normal strength, sensation and reflexes throughout    AssessmentPlan:   Aryan was seen today for follow-up.  Diagnoses and all orders for this visit:  Routine physical examination Today patient  counseled on age appropriate routine health concerns for screening and prevention, each reviewed and up to date or declined. Immunizations reviewed and up to date or declined. Labs ordered and reviewed. Risk factors for depression reviewed and negative. Hearing function and visual acuity are intact. ADLs screened and addressed as needed. Functional ability and level of safety reviewed and appropriate. Education, counseling and referrals performed based on assessed risks today. Patient provided with a copy of personalized plan for preventive services.  Need for Tdap vaccination -     Tdap vaccine greater than or equal to 7yo IM  Insulin resistance Recheck hemoglobin A1c. -     Hemoglobin A1c; Future  Essential hypertension Doing well without lisinopril.  Continue to monitor.  He is extremely meticulous in measuring his blood pressure and will contact us if he has any issues.  Follow-up in 6 months. -     CBC with Differential/Platelet; Future -     Comprehensive metabolic panel; Future  Hyperlipidemia, unspecified hyperlipidemia type Recheck today. -  Lipid panel; Future  History of prostate cancer He is requesting PSA, however I told him if it is elevated, he will to go back to urology patient is agreeable. -     PSA; Future    Well Adult Exam: Labs ordered: Yes. Patient counseling was done. See below for items discussed. Discussed the patient's BMI.  The BMI BMI is in the acceptable range Follow up in 6 months. Colon Cancer: UTD  Patient Counseling: [x]   Nutrition: Stressed importance of moderation in sodium/caffeine intake, saturated fat and cholesterol, caloric balance, sufficient intake of fresh fruits, vegetables, and fiber  [x]   Stressed the importance of regular exercise.   []   Substance Abuse: Discussed cessation/primary prevention of tobacco, alcohol, or other drug use; driving or other dangerous activities under the influence; availability of treatment for abuse.   [x]    Injury prevention: Discussed safety belts, safety helmets, smoke detector, smoking near bedding or upholstery.   []   Sexuality: Discussed sexually transmitted diseases, partner selection, use of condoms, avoidance of unintended pregnancy  and contraceptive alternatives.   [x]   Dental health: Discussed importance of regular tooth brushing, flossing, and dental visits.  [x]   Health maintenance and immunizations reviewed. Please refer to Health maintenance section.   CMA or LPN served as scribe during this visit. History, Physical, and Plan performed by medical provider. The above documentation has been reviewed and is accurate and complete.  [name], PA-C [place]

## 2018-06-15 ENCOUNTER — Other Ambulatory Visit: Payer: Self-pay | Admitting: Physician Assistant

## 2018-06-15 ENCOUNTER — Other Ambulatory Visit (INDEPENDENT_AMBULATORY_CARE_PROVIDER_SITE_OTHER): Payer: Medicare Other

## 2018-06-15 DIAGNOSIS — I10 Essential (primary) hypertension: Secondary | ICD-10-CM | POA: Diagnosis not present

## 2018-06-15 DIAGNOSIS — Z8546 Personal history of malignant neoplasm of prostate: Secondary | ICD-10-CM

## 2018-06-15 DIAGNOSIS — E785 Hyperlipidemia, unspecified: Secondary | ICD-10-CM | POA: Diagnosis not present

## 2018-06-15 DIAGNOSIS — E8881 Metabolic syndrome: Secondary | ICD-10-CM | POA: Diagnosis not present

## 2018-06-15 LAB — LIPID PANEL
Cholesterol: 112 mg/dL (ref 0–200)
HDL: 68 mg/dL (ref >39.00)
LDL Cholesterol: 28 mg/dL (ref 0–99)
NonHDL: 43.73
Total CHOL/HDL Ratio: 2
Triglycerides: 77 mg/dL (ref 0.0–149.0)
VLDL: 15.4 mg/dL (ref 0.0–40.0)

## 2018-06-15 LAB — CBC WITH DIFFERENTIAL/PLATELET
Basophils Absolute: 0 10*3/uL (ref 0.0–0.1)
Basophils Relative: 0.3 % (ref 0.0–3.0)
Eosinophils Absolute: 0.1 10*3/uL (ref 0.0–0.7)
Eosinophils Relative: 1.5 % (ref 0.0–5.0)
HCT: 43.8 % (ref 39.0–52.0)
Hemoglobin: 15.1 g/dL (ref 13.0–17.0)
Lymphocytes Relative: 30.9 % (ref 12.0–46.0)
Lymphs Abs: 1.6 10*3/uL (ref 0.7–4.0)
MCHC: 34.5 g/dL (ref 30.0–36.0)
MCV: 89.9 fl (ref 78.0–100.0)
Monocytes Absolute: 0.6 10*3/uL (ref 0.1–1.0)
Monocytes Relative: 11 % (ref 3.0–12.0)
Neutro Abs: 3 10*3/uL (ref 1.4–7.7)
Neutrophils Relative %: 56.3 % (ref 43.0–77.0)
Platelets: 153 10*3/uL (ref 150.0–400.0)
RBC: 4.87 Mil/uL (ref 4.22–5.81)
RDW: 13.2 % (ref 11.5–15.5)
WBC: 5.3 10*3/uL (ref 4.0–10.5)

## 2018-06-15 LAB — PSA: PSA: 0 ng/mL — ABNORMAL LOW (ref 0.10–4.00)

## 2018-06-15 LAB — COMPREHENSIVE METABOLIC PANEL
ALT: 29 U/L (ref 0–53)
AST: 19 U/L (ref 0–37)
Albumin: 4.4 g/dL (ref 3.5–5.2)
Alkaline Phosphatase: 65 U/L (ref 39–117)
BUN: 20 mg/dL (ref 6–23)
CO2: 31 mEq/L (ref 19–32)
Calcium: 9.4 mg/dL (ref 8.4–10.5)
Chloride: 102 mEq/L (ref 96–112)
Creatinine, Ser: 1.11 mg/dL (ref 0.40–1.50)
GFR: 68.65 mL/min (ref >60.00)
GLUCOSE: 135 mg/dL — AB (ref 70–99)
POTASSIUM: 4.4 meq/L (ref 3.5–5.1)
Sodium: 141 mEq/L (ref 135–145)
Total Bilirubin: 0.8 mg/dL (ref 0.2–1.2)
Total Protein: 6.7 g/dL (ref 6.0–8.3)

## 2018-06-15 LAB — HEMOGLOBIN A1C: Hgb A1c MFr Bld: 6.2 % (ref 4.6–6.5)

## 2018-06-15 MED ORDER — PRAVASTATIN SODIUM 20 MG PO TABS: 20.0000 mg | ORAL_TABLET | Freq: Every day | ORAL | 0 refills | Status: DC

## 2018-09-01 ENCOUNTER — Other Ambulatory Visit: Payer: Self-pay | Admitting: Physician Assistant

## 2018-09-02 ENCOUNTER — Other Ambulatory Visit: Payer: Self-pay | Admitting: Physician Assistant

## 2018-09-02 MED ORDER — PRAVASTATIN SODIUM 20 MG PO TABS: 20.0000 mg | ORAL_TABLET | Freq: Every day | ORAL | 1 refills | Status: DC

## 2018-12-12 ENCOUNTER — Ambulatory Visit: Payer: Medicare Other | Admitting: Physician Assistant

## 2019-01-24 ENCOUNTER — Other Ambulatory Visit: Payer: Self-pay | Admitting: Physician Assistant

## 2019-02-14 ENCOUNTER — Other Ambulatory Visit: Payer: Self-pay | Admitting: Physician Assistant

## 2019-03-21 ENCOUNTER — Other Ambulatory Visit: Payer: Self-pay

## 2019-03-21 ENCOUNTER — Ambulatory Visit (INDEPENDENT_AMBULATORY_CARE_PROVIDER_SITE_OTHER): Payer: Medicare Other

## 2019-03-21 DIAGNOSIS — Z23 Encounter for immunization: Secondary | ICD-10-CM

## 2019-04-28 ENCOUNTER — Telehealth: Payer: Self-pay

## 2019-04-28 NOTE — Telephone Encounter (Signed)
LM for patient to return call regarding metformin recall.

## 2019-05-10 ENCOUNTER — Other Ambulatory Visit: Payer: Self-pay

## 2019-05-10 MED ORDER — METFORMIN HCL 500 MG PO TABS: 500.0000 mg | ORAL_TABLET | Freq: Every day | ORAL | 1 refills | Status: DC

## 2019-05-10 NOTE — Telephone Encounter (Signed)
Spoke with patient.  He was agreeable to switching to immediate release metformin due to recall of extended release metformin.  Rx sent to mail order pharmacy for immediate release metformin 500 mg daily per [name], [place].  Patient verbalized understanding to instructions.

## 2019-05-19 LAB — HM DIABETES EYE EXAM

## 2019-06-15 ENCOUNTER — Other Ambulatory Visit: Payer: Self-pay

## 2019-06-16 ENCOUNTER — Encounter: Payer: Self-pay | Admitting: Physician Assistant

## 2019-06-16 ENCOUNTER — Ambulatory Visit (INDEPENDENT_AMBULATORY_CARE_PROVIDER_SITE_OTHER): Payer: Medicare Other | Admitting: Physician Assistant

## 2019-06-16 VITALS — BP 158/70 | HR 76 | Temp 97.6°F | Ht 68.25 in | Wt 151.0 lb

## 2019-06-16 DIAGNOSIS — Z13220 Encounter for screening for lipoid disorders: Secondary | ICD-10-CM

## 2019-06-16 DIAGNOSIS — Z0001 Encounter for general adult medical examination with abnormal findings: Secondary | ICD-10-CM

## 2019-06-16 DIAGNOSIS — Z125 Encounter for screening for malignant neoplasm of prostate: Secondary | ICD-10-CM | POA: Diagnosis not present

## 2019-06-16 DIAGNOSIS — I10 Essential (primary) hypertension: Secondary | ICD-10-CM | POA: Diagnosis not present

## 2019-06-16 DIAGNOSIS — R0989 Other specified symptoms and signs involving the circulatory and respiratory systems: Secondary | ICD-10-CM

## 2019-06-16 DIAGNOSIS — Z136 Encounter for screening for cardiovascular disorders: Secondary | ICD-10-CM

## 2019-06-16 DIAGNOSIS — T17308A Unspecified foreign body in larynx causing other injury, initial encounter: Secondary | ICD-10-CM

## 2019-06-16 DIAGNOSIS — E8881 Metabolic syndrome: Secondary | ICD-10-CM

## 2019-06-16 LAB — LIPID PANEL
Cholesterol: 147 mg/dL (ref 0–200)
HDL: 67.1 mg/dL (ref >39.00)
LDL Cholesterol: 58 mg/dL (ref 0–99)
NonHDL: 80.31
Total CHOL/HDL Ratio: 2
Triglycerides: 111 mg/dL (ref 0.0–149.0)
VLDL: 22.2 mg/dL (ref 0.0–40.0)

## 2019-06-16 LAB — CBC WITH DIFFERENTIAL/PLATELET
Basophils Absolute: 0 10*3/uL (ref 0.0–0.1)
Basophils Relative: 0.2 % (ref 0.0–3.0)
Eosinophils Absolute: 0 10*3/uL (ref 0.0–0.7)
Eosinophils Relative: 0.9 % (ref 0.0–5.0)
HCT: 44.1 % (ref 39.0–52.0)
Hemoglobin: 15 g/dL (ref 13.0–17.0)
Lymphocytes Relative: 25.5 % (ref 12.0–46.0)
Lymphs Abs: 1.1 10*3/uL (ref 0.7–4.0)
MCHC: 34.1 g/dL (ref 30.0–36.0)
MCV: 91.1 fl (ref 78.0–100.0)
Monocytes Absolute: 0.4 10*3/uL (ref 0.1–1.0)
Monocytes Relative: 8.8 % (ref 3.0–12.0)
Neutro Abs: 2.8 10*3/uL (ref 1.4–7.7)
Neutrophils Relative %: 64.6 % (ref 43.0–77.0)
Platelets: 143 10*3/uL — ABNORMAL LOW (ref 150.0–400.0)
RBC: 4.83 Mil/uL (ref 4.22–5.81)
RDW: 13.4 % (ref 11.5–15.5)
WBC: 4.3 10*3/uL (ref 4.0–10.5)

## 2019-06-16 LAB — TSH: TSH: 1.92 u[IU]/mL (ref 0.35–4.50)

## 2019-06-16 LAB — COMPREHENSIVE METABOLIC PANEL
ALT: 23 U/L (ref 0–53)
AST: 18 U/L (ref 0–37)
Albumin: 4.2 g/dL (ref 3.5–5.2)
Alkaline Phosphatase: 59 U/L (ref 39–117)
BUN: 23 mg/dL (ref 6–23)
CO2: 31 mEq/L (ref 19–32)
Calcium: 9.1 mg/dL (ref 8.4–10.5)
Chloride: 102 mEq/L (ref 96–112)
Creatinine, Ser: 1.04 mg/dL (ref 0.40–1.50)
GFR: 69.45 mL/min (ref >60.00)
Glucose, Bld: 124 mg/dL — ABNORMAL HIGH (ref 70–99)
Potassium: 4.4 mEq/L (ref 3.5–5.1)
Sodium: 141 mEq/L (ref 135–145)
Total Bilirubin: 0.9 mg/dL (ref 0.2–1.2)
Total Protein: 6.4 g/dL (ref 6.0–8.3)

## 2019-06-16 LAB — PSA: PSA: 0 ng/mL — ABNORMAL LOW (ref 0.10–4.00)

## 2019-06-16 LAB — HEMOGLOBIN A1C: Hgb A1c MFr Bld: 6 % (ref 4.6–6.5)

## 2019-06-16 NOTE — Patient Instructions (Signed)

## 2019-06-16 NOTE — Progress Notes (Signed)
Subjective:    Kenneth Salazar is a 73 y.o. male and is here for a comprehensive physical exam.   HPI  There are no preventive care reminders to display for this patient.  Acute Concerns: Choking/globus sensation --over the past few months he has noticed that he is having some issues with "choking on his saliva."  Every time he eats he always gets a sensation of his nose running and starting to get congested.  States that it does not matter what he eats.  He does not get any choking sensation when he eats any solids.  He also has to do excessive throat clearing and feels as though he has a knot stuck in his throat.  He denies: Unintentional weight loss, abdominal pain, uncontrolled fevers, night sweats, rectal bleeding.  Chronic Issues: Hypertension -- Currently taking lisinopril 10 mg. At home blood pressure readings are: well controlled, mostly <140/80. Patient denies chest pain, SOB, blurred vision, dizziness, unusual headaches, lower leg swelling. Patient is compliant with medication. Denies excessive caffeine intake, stimulant usage, excessive alcohol intake, or increase in salt consumption. Insulin resistance --currently taking 500 mg metformin daily with breakfast.  Last hemoglobin A1c a year ago was 6.2%.  He states that he does indulge in sweets every now and then.  Denies any symptoms of hypoglycemia he checks his blood sugars relatively regularly and does not have any hardly over 140.  Health Maintenance: Immunizations -- UTD Colonoscopy --due in 2022 PSA -- checked last year Diet -- feels like he eats a lot of sweets, does have lean proteins, blueberries, veggies Caffeine intake -- none Sleep habits --has seen up to urinate often but otherwise no issues Exercise -- walks for about 30 minutes daily Weight -- Weight: 151 lb (68.5 kg)  Recent weight history Wt Readings from Last 10 Encounters:  06/16/19 151 lb (68.5 kg)  06/14/18 153 lb 12.8 oz (69.8 kg)  06/14/18 153 lb  12.8 oz (69.8 kg)  01/26/18 149 lb 6.1 oz (67.8 kg)  01/04/18 148 lb 6.1 oz (67.3 kg)  09/21/17 158 lb 9.6 oz (71.9 kg)  06/15/17 167 lb (75.8 kg)  06/10/17 166 lb 9.6 oz (75.6 kg)  06/07/17 167 lb 6.4 oz (75.9 kg)  02/10/17 167 lb (75.8 kg)  Mood --some situational sadness with the pandemic, but denies any SI/HI or severe thoughts Alcohol use --none Tobacco use --none  Depression screen PHQ 2/9 06/16/2019  Decreased Interest 0  Down, Depressed, Hopeless 0  PHQ - 2 Score 0  Altered sleeping 0  Tired, decreased energy 0  Change in appetite 0  Feeling bad or failure about yourself  0  Trouble concentrating 0  Moving slowly or fidgety/restless 0  Suicidal thoughts 0  PHQ-9 Score 0  Difficult doing work/chores Not difficult at all    Other providers/specialists: Patient Care Team: [name], PA as PCP - General (Physician Assistant) Gastroenterology, Sadie Haber as Consulting Physician (Gastroenterology) Juluis Rainier as Consulting Physician (Optometry) Dentistry, [facility] as Consulting Physician (Dentistry)    PMHx, SurgHx, SocialHx, Medications, and Allergies were reviewed in the Visit Navigator and updated as appropriate.   Past Medical History:  Diagnosis Date  . Arthritis   . Cancer [facility])    prostate 01-2015  . Hypertension      Past Surgical History:  Procedure Laterality Date  . HERNIA REPAIR    . PROSTATE SURGERY     reomoved 01-2015  . [place] History  Problem Relation Age of Onset  . Diabetes Mother   . Stroke Father   . Diabetes Maternal Uncle     Social History   Tobacco Use  . Smoking status: Never Smoker  . Smokeless tobacco: Never Used  Substance Use Topics  . Alcohol use: Yes    Alcohol/week: 2.0 standard drinks    Types: 1 Glasses of wine, 1 Cans of beer per week    Comment: 1 beer or wine/week  . Drug use: No    Review of Systems:   Review of Systems  Constitutional: Negative for chills, fever,  malaise/fatigue and weight loss.  HENT: Negative for hearing loss, sinus pain and sore throat.   Respiratory: Negative for cough and hemoptysis.   Cardiovascular: Negative for chest pain, palpitations, leg swelling and PND.  Gastrointestinal: Negative for abdominal pain, constipation, diarrhea, heartburn, nausea and vomiting.  Genitourinary: Negative for dysuria, frequency and urgency.  Musculoskeletal: Negative for back pain, myalgias and neck pain.  Skin: Negative for itching and rash.  Neurological: Negative for dizziness, tingling, seizures and headaches.  Endo/Heme/Allergies: Negative for polydipsia.  Psychiatric/Behavioral: Negative for depression. The patient is not nervous/anxious.     Objective:    Vitals:   06/16/19 0903 06/16/19 0913  BP: (!) 168/80 (!) 158/70  Pulse: 76   Temp: 97.6 F (36.4 C)   SpO2: 98%    Body mass index is 22.79 kg/m.  General  Alert, cooperative, no distress, appears stated age  Head:  Normocephalic, without obvious abnormality, atraumatic  Eyes:  PERRL, conjunctiva/corneas clear, EOM's intact, fundi benign, both eyes       Ears:  Normal TM's and external ear canals, both ears  Nose: Nares normal, septum midline, mucosa normal, no drainage or sinus tenderness  Throat: Lips, mucosa, and tongue normal; teeth and gums normal  Neck: Supple, symmetrical, trachea midline, no adenopathy;     thyroid:  No enlargement/tenderness/nodules; no carotid bruit or JVD  Back:   Symmetric, no curvature, ROM normal, no CVA tenderness  Lungs:   Clear to auscultation bilaterally, respirations unlabored  Chest wall:  No tenderness or deformity  Heart:  Regular rate and rhythm, S1 and S2 normal, no murmur, rub or gallop  Abdomen:   Soft, non-tender, bowel sounds active all four quadrants, no masses, no organomegaly  Extremities: Extremities normal, atraumatic, no cyanosis or edema  Prostate : Not done   Skin: Skin color, texture, turgor normal, no rashes or  lesions  Lymph nodes: Cervical, supraclavicular, and axillary nodes normal  Neurologic: CNII-XII grossly intact. Normal strength, sensation and reflexes throughout    Results for orders placed or performed in visit on 05/19/19  HM DIABETES EYE EXAM  Result Value Ref Range   HM Diabetic Eye Exam      AssessmentPlan:   Kenneth Salazar was seen today for annual exam.  Diagnoses and all orders for this visit:  Encounter for general adult medical examination with abnormal findings Today patient counseled on age appropriate routine health concerns for screening and prevention, each reviewed and up to date or declined. Immunizations reviewed and up to date or declined. Labs ordered and reviewed. Risk factors for depression reviewed and negative. Hearing function and visual acuity are intact. ADLs screened and addressed as needed. Functional ability and level of safety reviewed and appropriate. Education, counseling and referrals performed based on assessed risks today. Patient provided with a copy of personalized plan for preventive services.  Essential hypertension Currently well controlled, continue lisinopril 10 mg daily. -  CBC with Differential/Platelet -     Comprehensive metabolic panel -     TSH -     Lipid panel  Insulin resistance We will update hemoglobin A1c and make recommendations based on that result.  Discussed need for portion control with sweets. -     Hemoglobin A1c  Prostate cancer screening -     PSA  Encounter for lipid screening for cardiovascular disease  Choking, initial encounter; Globus sensation No red flags on exam.  We discussed options to send to GI or do watchful waiting.  Patient has opted for watchful waiting and knows what red flag/worsening symptoms to reach out to [place] for.  Low threshold to send to GI for further evaluation and treatment.  Patient verbalized understanding to plan.   Well Adult Exam: Labs ordered: Yes. Patient counseling was done. See  below for items discussed. Discussed the patient's BMI.  The BMI BMI is in the acceptable range Follow up in one year. Colon Cancer: Up-to-date  Patient Counseling: [x]   Nutrition: Stressed importance of moderation in sodium/caffeine intake, saturated fat and cholesterol, caloric balance, sufficient intake of fresh fruits, vegetables, and fiber  [x]   Stressed the importance of regular exercise.   []   Substance Abuse: Discussed cessation/primary prevention of tobacco, alcohol, or other drug use; driving or other dangerous activities under the influence; availability of treatment for abuse.   [x]   Injury prevention: Discussed safety belts, safety helmets, smoke detector, smoking near bedding or upholstery.   []   Sexuality: Discussed sexually transmitted diseases, partner selection, use of condoms, avoidance of unintended pregnancy  and contraceptive alternatives.   [x]   Dental health: Discussed importance of regular tooth brushing, flossing, and dental visits.  [x]   Health maintenance and immunizations reviewed. Please refer to Health maintenance section.    [name], PA-C [place]

## 2019-06-19 ENCOUNTER — Other Ambulatory Visit: Payer: Self-pay

## 2019-06-19 ENCOUNTER — Ambulatory Visit (INDEPENDENT_AMBULATORY_CARE_PROVIDER_SITE_OTHER): Payer: Medicare Other

## 2019-06-19 ENCOUNTER — Telehealth: Payer: Self-pay | Admitting: Physician Assistant

## 2019-06-19 VITALS — BP 138/76

## 2019-06-19 DIAGNOSIS — Z0000 Encounter for general adult medical examination without abnormal findings: Secondary | ICD-10-CM | POA: Diagnosis not present

## 2019-06-19 NOTE — Progress Notes (Addendum)
This visit is being conducted via phone call due to the COVID-19 pandemic. This patient has given me verbal consent via phone to conduct this visit, patient states they are participating from their home address. Some vital signs may be absent or patient reported.   Patient identification: identified by name, DOB, and current address.  Location provider: [facility], Office Persons participating in the virtual visit: Denman George LPN and [name] PA     Subjective:   Kenneth Salazar is a 73 y.o. male who presents for Medicare Annual/Subsequent preventive examination.  Review of Systems:   Cardiac Risk Factors include: advanced age (>50men, >71 women);male gender;dyslipidemia;hypertension    Objective:    Vitals: BP 138/76   There is no height or weight on file to calculate BMI.  Advanced Directives 06/19/2019 06/14/2018 06/07/2017 01/14/2016  Does Patient Have a Medical Advance Directive? Yes Yes Yes Yes  Type of Paramedic of [place];Living will [place];Living will [place];Living will [place];Living will;Out of facility DNR (pink MOST or yellow form)  Does patient want to make changes to medical advance directive? No - Patient declined No - Patient declined No - Patient declined No - Patient declined  Copy of [place] in Chart? Yes - validated most recent copy scanned in chart (See row information) Yes - validated most recent copy scanned in chart (See row information) Yes No - copy requested    Tobacco Social History   Tobacco Use  Smoking Status Never Smoker  Smokeless Tobacco Never Used     Counseling given: Not Answered   Clinical Intake:  Pre-visit preparation completed: Yes  Pain : No/denies pain  Diabetes: No  How often do you need to have someone help you when you read instructions, pamphlets, or other written materials from your doctor or pharmacy?:  1 - Never  Interpreter Needed?: No  Information entered by :: Denman George LPN  Past Medical History:  Diagnosis Date  . Arthritis   . Cancer [facility])    prostate 01-2015  . Hypertension    Past Surgical History:  Procedure Laterality Date  . HERNIA REPAIR    . PROSTATE SURGERY     reomoved 01-2015  . VASECTOMY  1977   Family History  Problem Relation Age of Onset  . Diabetes Mother   . Stroke Father   . Diabetes Maternal Uncle    Social History   Socioeconomic History  . Marital status: Married    Spouse name: Not on file  . Number of children: Not on file  . Years of education: Not on file  . Highest education level: Not on file  Occupational History  . Occupation: retired    Comment: maintenance via computer   Social Needs  . Financial resource strain: Not on file  . Food insecurity    Worry: Not on file    Inability: Not on file  . Transportation needs    Medical: Not on file    Non-medical: Not on file  Tobacco Use  . Smoking status: Never Smoker  . Smokeless tobacco: Never Used  Substance and Sexual Activity  . Alcohol use: Yes    Alcohol/week: 2.0 standard drinks    Types: 1 Glasses of wine, 1 Cans of beer per week    Comment: 1 beer or wine/week  . Drug use: No  . Sexual activity: Never  Lifestyle  . Physical activity    Days per  week: Not on file    Minutes per session: Not on file  . Stress: Not on file  Relationships  . Social Herbalist on phone: Not on file    Gets together: Not on file    Attends religious service: Not on file    Active member of club or organization: Not on file    Attends meetings of clubs or organizations: Not on file    Relationship status: Not on file  Other Topics Concern  . Not on file  Social History Narrative   Retired at age 4   Worked for telephone company   Married   3 children, 1 grandkid    Outpatient Encounter Medications as of 06/19/2019  Medication Sig  . aspirin 325 MG EC tablet Take  325 mg by mouth every 6 (six) hours as needed for pain.   Marland Kitchen aspirin EC 81 MG tablet Take 81 mg by mouth daily.   . Inulin (FIBER CHOICE) 1.5 g CHEW Chew 6 tablets by mouth daily.   Marland Kitchen lisinopril (PRINIVIL,ZESTRIL) 10 MG tablet Take 1 tablet (10 mg total) by mouth daily.  . metFORMIN (GLUCOPHAGE) 500 MG tablet Take 1 tablet (500 mg total) by mouth daily with breakfast.  . Multiple Vitamin (MULTIVITAMIN) tablet Take 1 tablet by mouth daily.   . Multiple Vitamins-Minerals (OCULAR VITAMINS PO) Take 1 tablet by mouth daily.  . pravastatin (PRAVACHOL) 20 MG tablet TAKE 1 TABLET BY MOUTH  DAILY   No facility-administered encounter medications on file as of 06/19/2019.     Activities of Daily Living In your present state of health, do you have any difficulty performing the following activities: 06/19/2019 06/16/2019  Hearing? N N  Vision? N N  Difficulty concentrating or making decisions? N Y  Walking or climbing stairs? N N  Dressing or bathing? N N  Doing errands, shopping? N N  Preparing Food and eating ? N -  Using the Toilet? N -  In the past six months, have you accidently leaked urine? N -  Do you have problems with loss of bowel control? N -  Managing your Medications? N -  Managing your Finances? N -  Housekeeping or managing your Housekeeping? N -  Some recent data might be hidden    Patient Care Team: [name], [place] as PCP - General (Physician Assistant) Gastroenterology, Sadie Haber as Consulting Physician (Gastroenterology) Juluis Rainier as Consulting Physician (Optometry) Dentistry, [facility] as Consulting Physician (Dentistry)   Assessment:   This is a routine wellness examination for Kenneth Salazar.  Exercise Activities and Dietary recommendations Current Exercise Habits: Home exercise routine, Type of exercise: walking, Time (Minutes): 30, Frequency (Times/Week): 5, Weekly Exercise (Minutes/Week): 150  Goals    . Maintain current health status, not gain weight.        Fall Risk Fall Risk  06/19/2019 06/16/2019 06/14/2018 06/07/2017 02/10/2017  Falls in the past year? 0 0 0 No No  Number falls in past yr: - 0 - - -  Injury with Fall? 0 0 - - -  Follow up Falls evaluation completed;Education provided;Falls prevention discussed - - - -   Is the patient's home free of loose throw rugs in walkways, pet beds, electrical cords, etc?   yes      Grab bars in the bathroom? yes      Handrails on the stairs?   yes      Adequate lighting?   yes  Depression Screen PHQ 2/9 Scores 06/16/2019  06/14/2018 06/10/2017 06/07/2017  PHQ - 2 Score 0 0 0 0  PHQ- 9 Score 0 0 3 0    Cognitive Function MMSE - Mini Mental State Exam 06/07/2017  Orientation to time 5  Orientation to Place 5  Registration 3  Attention/ Calculation 5  Recall 3  Language- name 2 objects 2  Language- repeat 1  Language- follow 3 step command 3  Language- read & follow direction 1  Write a sentence 1  Copy design 1  Total score 30     6CIT Screen 06/19/2019  What Year? 0 points  What month? 0 points  What time? 0 points  Count back from 20 0 points  Months in reverse 0 points  Repeat phrase 2 points  Total Score 2    Immunization History  Administered Date(s) Administered  . Fluad Quad(high Dose 65+) 03/21/2019  . Influenza, High Dose Seasonal PF 06/07/2017, 04/14/2018  . Influenza-Unspecified 04/21/2016  . Pneumococcal Conjugate-13 05/18/2014  . Pneumococcal Polysaccharide-23 01/29/2009  . Td 01/17/2008  . Tdap 06/14/2018  . Zoster 01/17/2008  . Zoster Recombinat (Shingrix) 08/04/2018, 01/03/2019    Qualifies for Shingles Vaccine? Shingrix completed   Screening Tests Health Maintenance  Topic Date Due  . COLONOSCOPY  07/24/2020  . TETANUS/TDAP  06/14/2028  . INFLUENZA VACCINE  Completed  . Hepatitis C Screening  Completed  . PNA vac Low Risk Adult  Completed   Cancer Screenings: Lung: Low Dose CT Chest recommended if Age 62-80 years, 30 pack-year currently smoking  OR have quit w/in 15years. Patient does not qualify. Colorectal: colonoscopy 07/25/15; repeat in 5 years    Plan:  I have personally reviewed and addressed the Medicare Annual Wellness questionnaire and have noted the following in the patient's chart:  A. Medical and social history B. Use of alcohol, tobacco or illicit drugs  C. Current medications and supplements D. Functional ability and status E.  Nutritional status F.  Physical activity G. Advance directives H. List of other physicians I.  Hospitalizations, surgeries, and ER visits in previous 12 months J.  [place] such as hearing and vision if needed, cognitive and depression L. Referrals, records requested, and appointments- none   In addition, I have reviewed and discussed with patient certain preventive protocols, quality metrics, and best practice recommendations. A written personalized care plan for preventive services as well as general preventive health recommendations were provided to patient.   Signed,  Denman George, LPN  Nurse Health Advisor  I have reviewed documentation for AWV and Advance Care planning provided by [facility], I agree with documentation, I was immediately available for any questions. [name], [place]

## 2019-06-19 NOTE — Telephone Encounter (Signed)
Please call patient and let him know that I received his concerns about rechecking his blood sugar labs now that he is stopping metformin.  Please let him know that we can recheck a HgbA1c in a year.  Aldona Bar

## 2019-06-19 NOTE — Telephone Encounter (Signed)
Spoke to pt told him Aldona Bar said that she received his concerns about rechecking his blood sugar labs now that he is stopping metformin. Please let him know that we can recheck a HgbA1c in a year. Pt verbalized understanding and has schedule appt for next year.

## 2019-06-19 NOTE — Patient Instructions (Signed)
Kenneth Salazar , Thank you for taking time to come for your Medicare Wellness Visit. I appreciate your ongoing commitment to your health goals. Please review the following plan we discussed and let me know if I can assist you in the future.   Screening recommendations/referrals: Colorectal Screening: up to date; last colonoscopy 07/25/15; repeat due 07/2020  Vision and Dental Exams: Recommended annual ophthalmology exams for early detection of glaucoma and other disorders of the eye Recommended annual dental exams for proper oral hygiene  Vaccinations: Influenza vaccine: completed 03/21/19 Pneumococcal vaccine: up to date; last 05/18/14 Tdap vaccine: up to date; last 06/14/18  Shingles vaccine:Shingrix completed   Advanced directives: We have received a copy of your POA (Power of [place]) and/or Living Will. These documents can be located in your chart.  Goals: Recommend to drink at least 6-8 8oz glasses of water per day and consume a balanced diet rich in fresh fruits and vegetables.   Next appointment: Please schedule your Annual Wellness Visit with your Nurse Health Advisor in one year.  Preventive Care 75 Years and Older, Male Preventive care refers to lifestyle choices and visits with your health care provider that can promote health and wellness. What does preventive care include?  A yearly physical exam. This is also called an annual well check.  Dental exams once or twice a year.  Routine eye exams. Ask your health care provider how often you should have your eyes checked.  Personal lifestyle choices, including:  Daily care of your teeth and gums.  Regular physical activity.  Eating a healthy diet.  Avoiding tobacco and drug use.  Limiting alcohol use.  Practicing safe sex.  Taking low doses of aspirin every day if recommended by your health care provider..  Taking vitamin and mineral supplements as recommended by your health care provider. What happens during an  annual well check? The services and screenings done by your health care provider during your annual well check will depend on your age, overall health, lifestyle risk factors, and family history of disease. Counseling  Your health care provider may ask you questions about your:  Alcohol use.  Tobacco use.  Drug use.  Emotional well-being.  Home and relationship well-being.  Sexual activity.  Eating habits.  History of falls.  Memory and ability to understand (cognition).  Work and work Statistician. Screening  You may have the following tests or measurements:  Height, weight, and BMI.  Blood pressure.  Lipid and cholesterol levels. These may be checked every 5 years, or more frequently if you are over 75 years old.  Skin check.  Lung cancer screening. You may have this screening every year starting at age 75 if you have a 30-pack-year history of smoking and currently smoke or have quit within the past 15 years.  Fecal occult blood test (FOBT) of the stool. You may have this test every year starting at age 75.  Flexible sigmoidoscopy or colonoscopy. You may have a sigmoidoscopy every 5 years or a colonoscopy every 10 years starting at age 75.  Prostate cancer screening. Recommendations will vary depending on your family history and other risks.  Hepatitis C blood test.  Hepatitis B blood test.  Sexually transmitted disease (STD) testing.  Diabetes screening. This is done by checking your blood sugar (glucose) after you have not eaten for a while (fasting). You may have this done every 1-3 years.  Abdominal aortic aneurysm (AAA) screening. You may need this if you are a current or former smoker.  Osteoporosis. You may be screened starting at age 75 if you are at high risk. Talk with your health care provider about your test results, treatment options, and if necessary, the need for more tests. Vaccines  Your health care provider may recommend certain vaccines,  such as:  Influenza vaccine. This is recommended every year.  Tetanus, diphtheria, and acellular pertussis (Tdap, Td) vaccine. You may need a Td booster every 10 years.  Zoster vaccine. You may need this after age 75.  Pneumococcal 13-valent conjugate (PCV13) vaccine. One dose is recommended after age 75.  Pneumococcal polysaccharide (PPSV23) vaccine. One dose is recommended after age 75. Talk to your health care provider about which screenings and vaccines you need and how often you need them. This information is not intended to replace advice given to you by your health care provider. Make sure you discuss any questions you have with your health care provider. Document Released: 07/26/2015 Document Revised: 03/18/2016 Document Reviewed: 04/30/2015 Elsevier Interactive Patient Education  2017 [place] Prevention in the Home Falls can cause injuries. They can happen to people of all ages. There are many things you can do to make your home safe and to help prevent falls. What can I do on the outside of my home?  Regularly fix the edges of walkways and driveways and fix any cracks.  Remove anything that might make you trip as you walk through a door, such as a raised step or threshold.  Trim any bushes or trees on the path to your home.  Use bright outdoor lighting.  Clear any walking paths of anything that might make someone trip, such as rocks or tools.  Regularly check to see if handrails are loose or broken. Make sure that both sides of any steps have handrails.  Any raised decks and porches should have guardrails on the edges.  Have any leaves, snow, or ice cleared regularly.  Use sand or salt on walking paths during winter.  Clean up any spills in your garage right away. This includes oil or grease spills. What can I do in the bathroom?  Use night lights.  Install grab bars by the toilet and in the tub and shower. Do not use towel bars as grab bars.  Use  non-skid mats or decals in the tub or shower.  If you need to sit down in the shower, use a plastic, non-slip stool.  Keep the floor dry. Clean up any water that spills on the floor as soon as it happens.  Remove soap buildup in the tub or shower regularly.  Attach bath mats securely with double-sided non-slip rug tape.  Do not have throw rugs and other things on the floor that can make you trip. What can I do in the bedroom?  Use night lights.  Make sure that you have a light by your bed that is easy to reach.  Do not use any sheets or blankets that are too big for your bed. They should not hang down onto the floor.  Have a firm chair that has side arms. You can use this for support while you get dressed.  Do not have throw rugs and other things on the floor that can make you trip. What can I do in the kitchen?  Clean up any spills right away.  Avoid walking on wet floors.  Keep items that you use a lot in easy-to-reach places.  If you need to reach something above you, use a strong step  stool that has a grab bar.  Keep electrical cords out of the way.  Do not use floor polish or wax that makes floors slippery. If you must use wax, use non-skid floor wax.  Do not have throw rugs and other things on the floor that can make you trip. What can I do with my stairs?  Do not leave any items on the stairs.  Make sure that there are handrails on both sides of the stairs and use them. Fix handrails that are broken or loose. Make sure that handrails are as long as the stairways.  Check any carpeting to make sure that it is firmly attached to the stairs. Fix any carpet that is loose or worn.  Avoid having throw rugs at the top or bottom of the stairs. If you do have throw rugs, attach them to the floor with carpet tape.  Make sure that you have a light switch at the top of the stairs and the bottom of the stairs. If you do not have them, ask someone to add them for you. What  else can I do to help prevent falls?  Wear shoes that:  Do not have high heels.  Have rubber bottoms.  Are comfortable and fit you well.  Are closed at the toe. Do not wear sandals.  If you use a stepladder:  Make sure that it is fully opened. Do not climb a closed stepladder.  Make sure that both sides of the stepladder are locked into place.  Ask someone to hold it for you, if possible.  Clearly mark and make sure that you can see:  Any grab bars or handrails.  First and last steps.  Where the edge of each step is.  Use tools that help you move around (mobility aids) if they are needed. These include:  Canes.  Walkers.  Scooters.  Crutches.  Turn on the lights when you go into a dark area. Replace any light bulbs as soon as they burn out.  Set up your furniture so you have a clear path. Avoid moving your furniture around.  If any of your floors are uneven, fix them.  If there are any pets around you, be aware of where they are.  Review your medicines with your doctor. Some medicines can make you feel dizzy. This can increase your chance of falling. Ask your doctor what other things that you can do to help prevent falls. This information is not intended to replace advice given to you by your health care provider. Make sure you discuss any questions you have with your health care provider. Document Released: 04/25/2009 Document Revised: 12/05/2015 Document Reviewed: 08/03/2014 Elsevier Interactive Patient Education  2017 [employer].

## 2019-06-26 ENCOUNTER — Other Ambulatory Visit: Payer: Self-pay | Admitting: Physician Assistant

## 2019-08-08 ENCOUNTER — Ambulatory Visit: Payer: Medicare Other

## 2019-08-17 ENCOUNTER — Ambulatory Visit: Payer: Medicare Other | Attending: Internal Medicine

## 2019-08-17 DIAGNOSIS — Z23 Encounter for immunization: Secondary | ICD-10-CM | POA: Insufficient documentation

## 2019-08-17 NOTE — Progress Notes (Signed)
   Covid-19 Vaccination Clinic  Name:  Kenneth Salazar    MRN: [id number] DOB: 1946-04-23  08/17/2019  [name] was observed post Covid-19 immunization for 15 minutes without incidence. He was provided with Vaccine Information Sheet and instruction to access the V-Safe system.   [name] was instructed to call 911 with any severe reactions post vaccine: Marland Kitchen Difficulty breathing  . Swelling of your face and throat  . A fast heartbeat  . A bad rash all over your body  . Dizziness and weakness    Immunizations Administered    Name Date Dose VIS Date Route   Pfizer COVID-19 Vaccine 08/17/2019  3:55 PM 0.3 mL 06/23/2019 Intramuscular   Manufacturer: [place]   Lot: [id number]   [place]: [id number]

## 2019-08-25 ENCOUNTER — Ambulatory Visit: Payer: Medicare Other

## 2019-09-11 ENCOUNTER — Ambulatory Visit: Payer: Medicare Other | Attending: Internal Medicine

## 2019-09-11 DIAGNOSIS — Z23 Encounter for immunization: Secondary | ICD-10-CM | POA: Insufficient documentation

## 2019-09-11 NOTE — Progress Notes (Signed)
   Covid-19 Vaccination Clinic  Name:  Kenneth Salazar    MRN: [id number] DOB: 1945/08/25  09/11/2019  Mr. Show was observed post Covid-19 immunization for 15 minutes without incidence. He was provided with Vaccine Information Sheet and instruction to access the V-Safe system.   [name] was instructed to call 911 with any severe reactions post vaccine: Marland Kitchen Difficulty breathing  . Swelling of your face and throat  . A fast heartbeat  . A bad rash all over your body  . Dizziness and weakness    Immunizations Administered    Name Date Dose VIS Date Route   Pfizer COVID-19 Vaccine 09/11/2019  3:46 PM 0.3 mL 06/23/2019 Intramuscular   Manufacturer: [place]   Lot: [id number]   [place]: [id number]

## 2020-04-22 IMAGING — DX DG HIP (WITH OR WITHOUT PELVIS) 2-3V*R*
3 series · 3 of 3 positions shown · non-contrast
Comparison: None in PACs

CLINICAL DATA: Six months of right hip pain with no known injury.
History of prostate malignancy status postprostatectomy.

EXAM:
DG HIP (WITH OR WITHOUT PELVIS) 2-3V RIGHT

[pelvis ap]
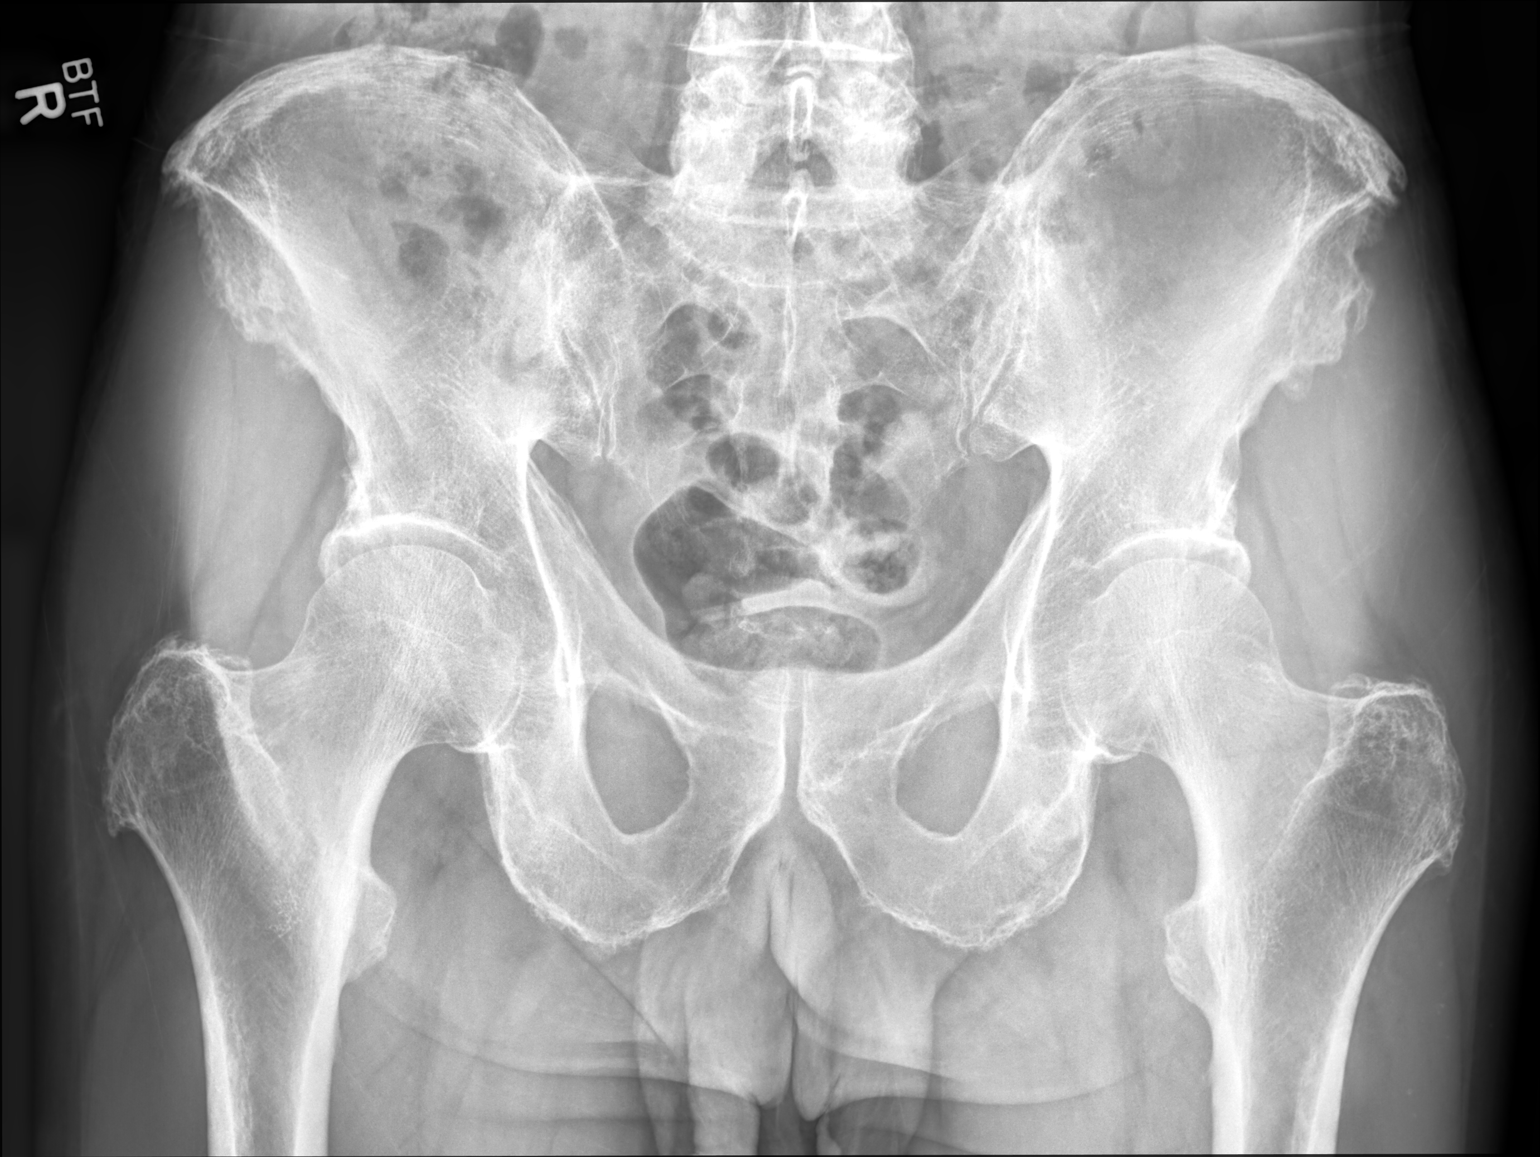

[hip joint ap]
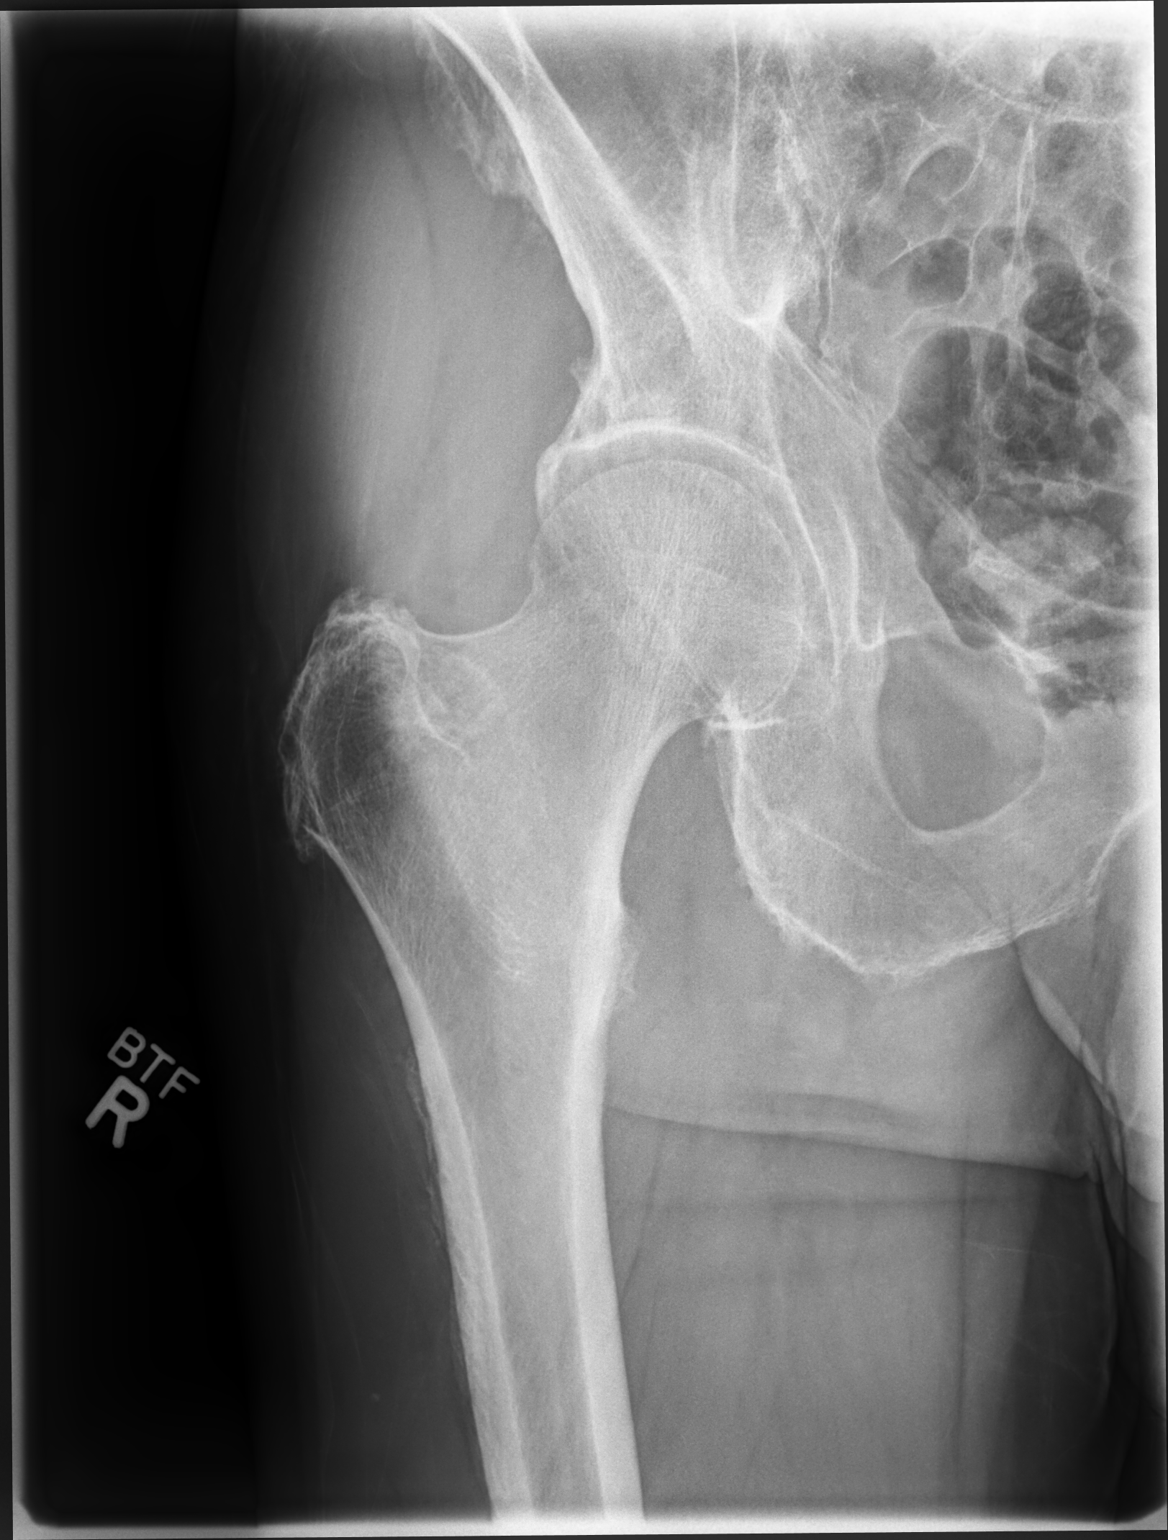

[hip joint (frog view)]
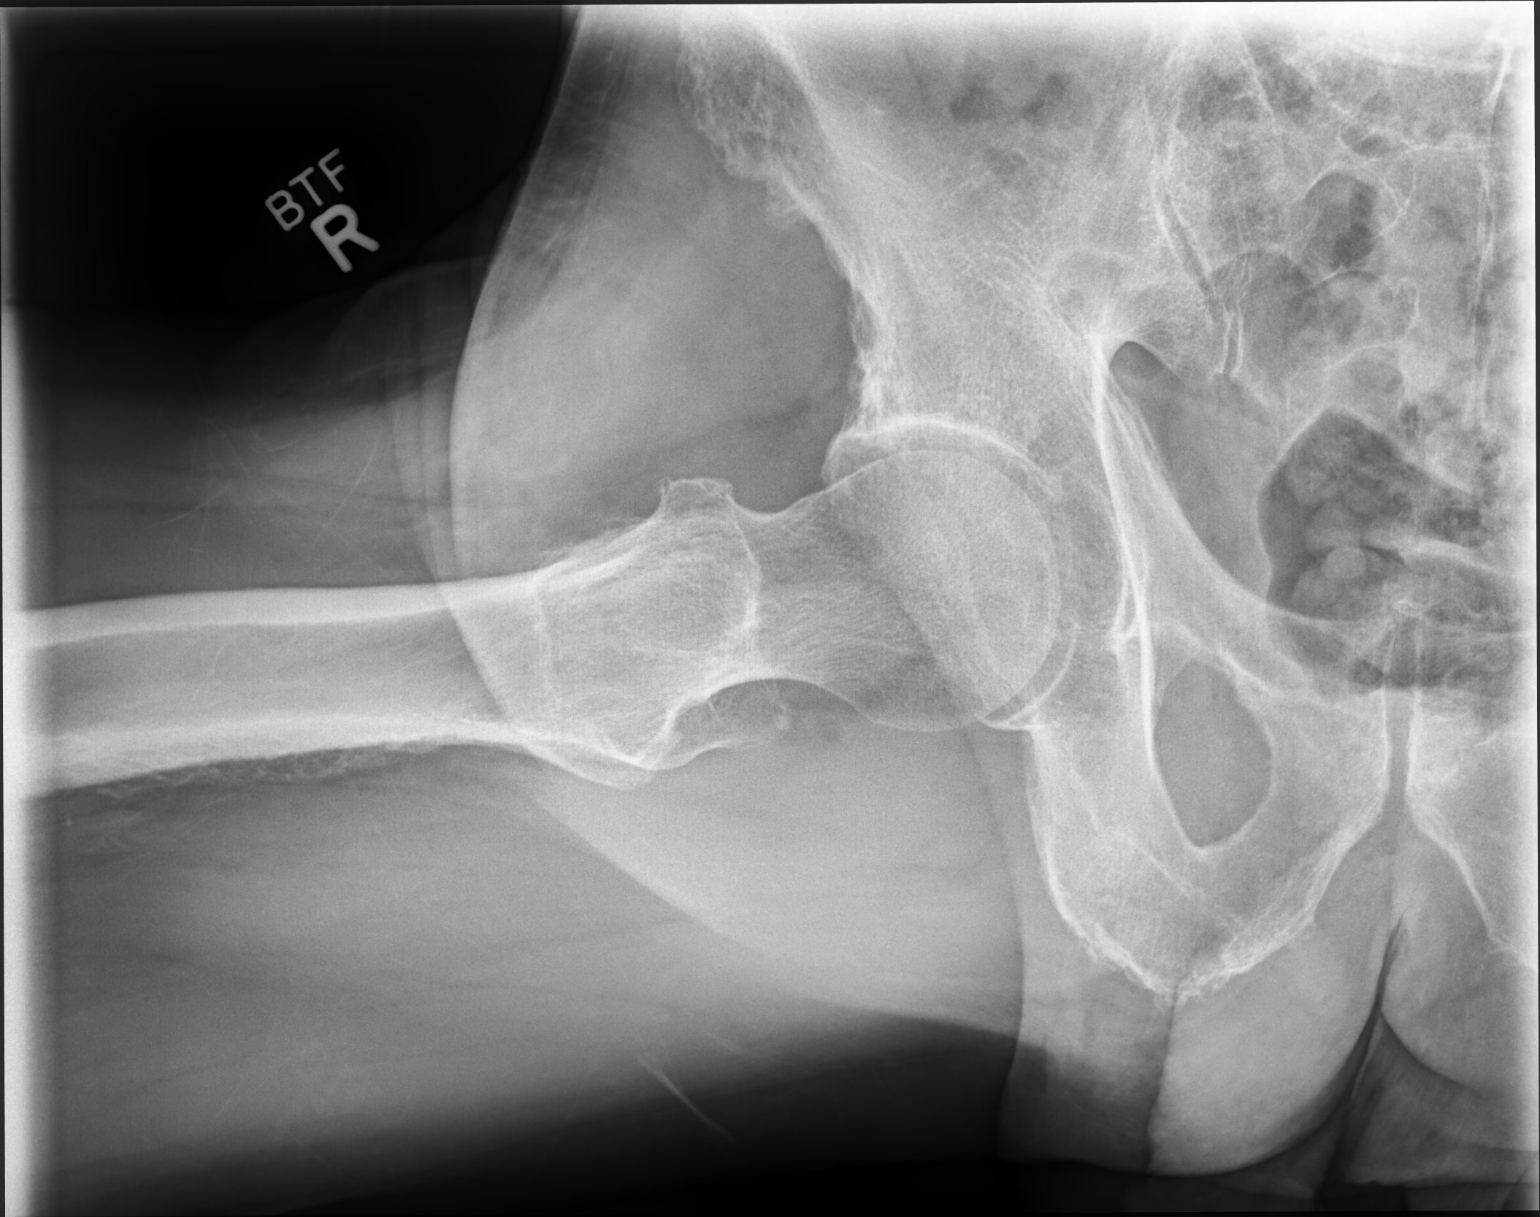

[3 of 3 positions shown; findings below may reference images not displayed]

FINDINGS: The bones are subjectively adequately mineralized. There is no lytic
nor blastic lesion of the pelvis or right hip. The right hip joint
space is well maintained. The articular surfaces of the right
femoral head and acetabulum remains smoothly rounded. The femoral
neck, intertrochanteric, and subtrochanteric regions are normal.
IMPRESSION: There is no acute bony abnormality of the right hip nor evidence of
significant degenerative change.

## 2020-05-22 LAB — HM DIABETES EYE EXAM

## 2020-06-18 ENCOUNTER — Ambulatory Visit: Payer: Self-pay

## 2020-06-18 ENCOUNTER — Other Ambulatory Visit: Payer: Self-pay

## 2020-06-18 ENCOUNTER — Encounter: Payer: Self-pay | Admitting: Physician Assistant

## 2020-06-18 ENCOUNTER — Ambulatory Visit (INDEPENDENT_AMBULATORY_CARE_PROVIDER_SITE_OTHER): Payer: Medicare Other | Admitting: Physician Assistant

## 2020-06-18 VITALS — BP 138/82 | HR 76 | Temp 98.1°F | Ht 68.0 in | Wt 161.4 lb

## 2020-06-18 DIAGNOSIS — E88819 Insulin resistance, unspecified: Secondary | ICD-10-CM

## 2020-06-18 DIAGNOSIS — Z0001 Encounter for general adult medical examination with abnormal findings: Secondary | ICD-10-CM | POA: Diagnosis not present

## 2020-06-18 DIAGNOSIS — E8881 Metabolic syndrome: Secondary | ICD-10-CM

## 2020-06-18 DIAGNOSIS — Z1211 Encounter for screening for malignant neoplasm of colon: Secondary | ICD-10-CM

## 2020-06-18 DIAGNOSIS — C61 Malignant neoplasm of prostate: Secondary | ICD-10-CM | POA: Diagnosis not present

## 2020-06-18 DIAGNOSIS — E785 Hyperlipidemia, unspecified: Secondary | ICD-10-CM

## 2020-06-18 DIAGNOSIS — H9193 Unspecified hearing loss, bilateral: Secondary | ICD-10-CM | POA: Diagnosis not present

## 2020-06-18 NOTE — Patient Instructions (Addendum)
It was great to see you!  We cleaned out your ears today  May use cotton ball soaked in mineral oil into ear 10-20 min per week if having recurrent ear wax accumulation. May use dilute hydrogen peroxide (equal parts this and water) and place a few drops into ear every 2 weeks to help break down wax buildup.  Please go to the lab for blood work.   Our office will call you with your results unless you have chosen to receive results via MyChart.  If your blood work is normal we will follow-up each year for physicals and as scheduled for chronic medical problems.  If anything is abnormal we will treat accordingly and get you in for a follow-up.  Take care,  [facility] Maintenance, Male Adopting a healthy lifestyle and getting preventive care are important in promoting health and wellness. Ask your health care provider about:  The right schedule for you to have regular tests and exams.  Things you can do on your own to prevent diseases and keep yourself healthy. What should I know about diet, weight, and exercise? Eat a healthy diet   Eat a diet that includes plenty of vegetables, fruits, low-fat dairy products, and lean protein.  Do not eat a lot of foods that are high in solid fats, added sugars, or sodium. Maintain a healthy weight Body mass index (BMI) is a measurement that can be used to identify possible weight problems. It estimates body fat based on height and weight. Your health care provider can help determine your BMI and help you achieve or maintain a healthy weight. Get regular exercise Get regular exercise. This is one of the most important things you can do for your health. Most adults should:  Exercise for at least 150 minutes each week. The exercise should increase your heart rate and make you sweat (moderate-intensity exercise).  Do strengthening exercises at least twice a week. This is in addition to the moderate-intensity exercise.  Spend less time  sitting. Even light physical activity can be beneficial. Watch cholesterol and blood lipids Have your blood tested for lipids and cholesterol at 76 years of age, then have this test every 5 years. You may need to have your cholesterol levels checked more often if:  Your lipid or cholesterol levels are high.  You are older than 76 years of age.  You are at high risk for heart disease. What should I know about cancer screening? Many types of cancers can be detected early and may often be prevented. Depending on your health history and family history, you may need to have cancer screening at various ages. This may include screening for:  Colorectal cancer.  Prostate cancer.  Skin cancer.  Lung cancer. What should I know about heart disease, diabetes, and high blood pressure? Blood pressure and heart disease  High blood pressure causes heart disease and increases the risk of stroke. This is more likely to develop in people who have high blood pressure readings, are of African descent, or are overweight.  Talk with your health care provider about your target blood pressure readings.  Have your blood pressure checked: ? Every 3-5 years if you are 53-75 years of age. ? Every year if you are 43 years old or older.  If you are between the ages of 25 and 84 and are a current or former smoker, ask your health care provider if you should have a one-time screening for abdominal aortic aneurysm (AAA). Diabetes  Have regular diabetes screenings. This checks your fasting blood sugar level. Have the screening done:  Once every three years after age 62 if you are at a normal weight and have a low risk for diabetes.  More often and at a younger age if you are overweight or have a high risk for diabetes. What should I know about preventing infection? Hepatitis B If you have a higher risk for hepatitis B, you should be screened for this virus. Talk with your health care provider to find out if you  are at risk for hepatitis B infection. Hepatitis C Blood testing is recommended for:  Everyone born from [birth year] through 1967.  Anyone with known risk factors for hepatitis C. Sexually transmitted infections (STIs)  You should be screened each year for STIs, including gonorrhea and chlamydia, if: ? You are sexually active and are younger than 76 years of age. ? You are older than 76 years of age and your health care provider tells you that you are at risk for this type of infection. ? Your sexual activity has changed since you were last screened, and you are at increased risk for chlamydia or gonorrhea. Ask your health care provider if you are at risk.  Ask your health care provider about whether you are at high risk for HIV. Your health care provider may recommend a prescription medicine to help prevent HIV infection. If you choose to take medicine to prevent HIV, you should first get tested for HIV. You should then be tested every 3 months for as long as you are taking the medicine. Follow these instructions at home: Lifestyle  Do not use any products that contain nicotine or tobacco, such as cigarettes, e-cigarettes, and chewing tobacco. If you need help quitting, ask your health care provider.  Do not use street drugs.  Do not share needles.  Ask your health care provider for help if you need support or information about quitting drugs. Alcohol use  Do not drink alcohol if your health care provider tells you not to drink.  If you drink alcohol: ? Limit how much you have to 0-2 drinks a day. ? Be aware of how much alcohol is in your drink. In the [place] one drink equals one 12 oz bottle of beer (355 mL), one 5 oz glass of wine (148 mL), or one 1 oz glass of hard liquor (44 mL). General instructions  Schedule regular health, dental, and eye exams.  Stay current with your vaccines.  Tell your health care provider if: ? You often feel depressed. ? You have ever been abused or do  not feel safe at home. Summary  Adopting a healthy lifestyle and getting preventive care are important in promoting health and wellness.  Follow your health care provider's instructions about healthy diet, exercising, and getting tested or screened for diseases.  Follow your health care provider's instructions on monitoring your cholesterol and blood pressure. This information is not intended to replace advice given to you by your health care provider. Make sure you discuss any questions you have with your health care provider. Document Revised: 06/22/2018 Document Reviewed: 06/22/2018 Elsevier Patient Education  2020 [employer].

## 2020-06-18 NOTE — Progress Notes (Signed)
I acted as a Education administrator for [employer], PA-C Anselmo Pickler, LPN   Subjective:    Kenneth Salazar is a 74 y.o. male and is here for a comprehensive physical exam.   HPI  There are no preventive care reminders to display for this patient.  Acute Concerns: Hearing loss -- doesn't clean ears regularly. L ear has significant hearing loss. Hasn't been wearing his hearing aids because they are not helping. Denies recent URI symptoms, use of q-tips.   Chronic Issues: HTN -- Currently taking no medications. At home blood pressure readings are: WNL. Patient denies chest pain, SOB, blurred vision, dizziness, unusual headaches, lower leg swelling. Denies excessive caffeine intake, stimulant usage, excessive alcohol intake, or increase in salt consumption.  BP Readings from Last 3 Encounters:  06/18/20 138/82  06/19/19 138/76  06/16/19 (!) 158/70   Insulin resistance -- we stopped metformin at last visit because his A1c was so very well controlled. His blood sugars are ranging from 118-141. Still indulges in sweets occasionally.  Hx of prostate cancer -- denies any unusual back pain, changes in urination. Due for PSA recheck.  Health Maintenance: Immunizations -- UTD Colonoscopy -- due 07/2020 PSA -- 06/2019 normal Diet -- overall doing "okay"; indulges in sweets Caffeine intake -- none Sleep habits -- okay, does have to urinate regularly Exercise -- walking regularly Weight -- Weight: 161 lb 6.1 oz (73.2 kg)  Recent weight history Wt Readings from Last 10 Encounters:  06/18/20 161 lb 6.1 oz (73.2 kg)  06/16/19 151 lb (68.5 kg)  06/14/18 153 lb 12.8 oz (69.8 kg)  06/14/18 153 lb 12.8 oz (69.8 kg)  01/26/18 149 lb 6.1 oz (67.8 kg)  01/04/18 148 lb 6.1 oz (67.3 kg)  09/21/17 158 lb 9.6 oz (71.9 kg)  06/15/17 167 lb (75.8 kg)  06/10/17 166 lb 9.6 oz (75.6 kg)  06/07/17 167 lb 6.4 oz (75.9 kg)   Body mass index is 24.54 kg/m. Mood -- stable; denies acute concerns Alcohol use  -- none Tobacco use -- none  Depression screen PHQ 2/9 06/18/2020  Decreased Interest 0  Down, Depressed, Hopeless 0  PHQ - 2 Score 0  Altered sleeping -  Tired, decreased energy -  Change in appetite -  Feeling bad or failure about yourself  -  Trouble concentrating -  Moving slowly or fidgety/restless -  Suicidal thoughts -  PHQ-9 Score -  Difficult doing work/chores -    Other providers/specialists: Patient Care Team: [name], [place] as PCP - General (Physician Assistant) Gastroenterology, Sadie Haber as Consulting Physician (Gastroenterology) Juluis Rainier as Consulting Physician (Optometry) Dentistry, [facility] as Consulting Physician (Dentistry)    PMHx, SurgHx, SocialHx, Medications, and Allergies were reviewed in the Visit Navigator and updated as appropriate.   Past Medical History:  Diagnosis Date  . Arthritis   . Cancer [facility])    prostate 01-2015  . Hypertension      Past Surgical History:  Procedure Laterality Date  . HERNIA REPAIR    . PROSTATE SURGERY     reomoved 01-2015  . VASECTOMY  1977     Family History  Problem Relation Age of Onset  . Diabetes Mother   . Stroke Father   . Diabetes Maternal Uncle     Social History   Tobacco Use  . Smoking status: Never Smoker  . Smokeless tobacco: Never Used  Vaping Use  . Vaping Use: Never used  Substance Use Topics  . Alcohol use: Yes  Alcohol/week: 2.0 standard drinks    Types: 1 Glasses of wine, 1 Cans of beer per week    Comment: 1 beer or wine/week  . Drug use: No    Review of Systems:   Review of Systems  Constitutional: Negative.  Negative for chills, fever, malaise/fatigue and weight loss.  HENT: Positive for hearing loss (x 2 weeks). Negative for sinus pain and sore throat.   Eyes: Negative for blurred vision.  Respiratory: Negative.  Negative for cough and shortness of breath.   Cardiovascular: Negative.  Negative for chest pain, palpitations and leg swelling.    Gastrointestinal: Positive for heartburn. Negative for abdominal pain, constipation, diarrhea, nausea and vomiting.  Genitourinary: Negative for dysuria, frequency and urgency.  Musculoskeletal: Negative.  Negative for back pain, myalgias and neck pain.  Skin: Negative.  Negative for itching and rash.  Neurological: Negative.  Negative for dizziness, tingling, seizures, loss of consciousness and headaches.  Endo/Heme/Allergies: Negative.  Negative for polydipsia.  Psychiatric/Behavioral: Negative.  Negative for depression. The patient is not nervous/anxious.     Objective:    Vitals:   06/18/20 0920  BP: 138/82  Pulse: 76  Temp: 98.1 F (36.7 C)  SpO2: 98%   Body mass index is 24.54 kg/m.  General  Alert, cooperative, no distress, appears stated age  Head:  Normocephalic, without obvious abnormality, atraumatic  Eyes:  PERRL, conjunctiva/corneas clear, EOM's intact, fundi benign, both eyes       Ears:  Bilateral cerumen impaction  Nose: Nares normal, septum midline, mucosa normal, no drainage or sinus tenderness  Throat: Lips, mucosa, and tongue normal; teeth and gums normal  Neck: Supple, symmetrical, trachea midline, no adenopathy;     thyroid:  No enlargement/tenderness/nodules; no carotid bruit or JVD  Back:   Symmetric, no curvature, ROM normal, no CVA tenderness  Lungs:   Clear to auscultation bilaterally, respirations unlabored  Chest wall:  No tenderness or deformity  Heart:  Regular rate and rhythm, S1 and S2 normal, no murmur, rub or gallop  Abdomen:   Soft, non-tender, bowel sounds active all four quadrants, no masses, no organomegaly  Extremities: Extremities normal, atraumatic, no cyanosis or edema  Prostate : Not done   Skin: Skin color, texture, turgor normal, no rashes or lesions  Lymph nodes: Cervical, supraclavicular, and axillary nodes normal  Neurologic: CNII-XII grossly intact. Normal strength, sensation and reflexes throughout   AssessmentPlan:    Silas was seen today for annual exam.  Diagnoses and all orders for this visit:  Encounter for general adult medical examination with abnormal findings Today patient counseled on age appropriate routine health concerns for screening and prevention, each reviewed and up to date or declined. Immunizations reviewed and up to date or declined. Labs ordered and reviewed. Risk factors for depression reviewed and negative. Hearing function and visual acuity are intact. ADLs screened and addressed as needed. Functional ability and level of safety reviewed and appropriate. Education, counseling and referrals performed based on assessed risks today. Patient provided with a copy of personalized plan for preventive services.  Insulin resistance Update A1c today and provide further recommendations as indicated. Continue to work on healthy diet and continue walking. -     CBC with Differential/Platelet; Future -     Comprehensive metabolic panel; Future -     Hemoglobin A1c; Future -     Hemoglobin A1c -     Comprehensive metabolic panel -     CBC with Differential/Platelet  Malignant neoplasm of prostate ([place]) Update  PSA today. Referral to urology if/when indicated. -     PSA; Future -     PSA  Bilateral hearing loss, unspecified hearing loss type Ear lavage performed today. No evidence of infection after lavage completed. Tolerated procedure well and had resolution of symptoms.  Hyperlipidemia, unspecified hyperlipidemia type Update lipid panel and adjust pravachol 20 mg if indicated. -     Lipid panel; Future -     Lipid panel   Well Adult Exam: Labs ordered: Yes. Patient counseling was done. See below for items discussed.   Discussed the patient's BMI.  The BMI BMI is in the acceptable range   Follow up in one year.   Patient Counseling: [x]   Nutrition: Stressed importance of moderation in sodium/caffeine intake, saturated fat and cholesterol, caloric balance, sufficient intake of  fresh fruits, vegetables, and fiber  [x]   Stressed the importance of regular exercise.   []   Substance Abuse: Discussed cessation/primary prevention of tobacco, alcohol, or other drug use; driving or other dangerous activities under the influence; availability of treatment for abuse.   [x]   Injury prevention: Discussed safety belts, safety helmets, smoke detector, smoking near bedding or upholstery.   []   Sexuality: Discussed sexually transmitted diseases, partner selection, use of condoms, avoidance of unintended pregnancy  and contraceptive alternatives.   [x]   Dental health: Discussed importance of regular tooth brushing, flossing, and dental visits.  [x]   Health maintenance and immunizations reviewed. Please refer to Health maintenance section.   CMA or LPN served as scribe during this visit. History, Physical, and Plan performed by medical provider. The above documentation has been reviewed and is accurate and complete.  [name], PA-C [place]

## 2020-06-19 ENCOUNTER — Other Ambulatory Visit: Payer: Self-pay | Admitting: Physician Assistant

## 2020-06-19 LAB — CBC WITH DIFFERENTIAL/PLATELET
Absolute Monocytes: 357 cells/uL (ref 200–950)
Basophils Absolute: 29 cells/uL (ref 0–200)
Basophils Relative: 0.7 %
Eosinophils Absolute: 62 cells/uL (ref 15–500)
Eosinophils Relative: 1.5 %
HCT: 45.1 % (ref 38.5–50.0)
Hemoglobin: 15.4 g/dL (ref 13.2–17.1)
Lymphs Abs: 1140 cells/uL (ref 850–3900)
MCH: 31 pg (ref 27.0–33.0)
MCHC: 34.1 g/dL (ref 32.0–36.0)
MCV: 90.7 fL (ref 80.0–100.0)
MPV: 9.9 fL (ref 7.5–12.5)
Monocytes Relative: 8.7 %
Neutro Abs: 2513 cells/uL (ref 1500–7800)
Neutrophils Relative %: 61.3 %
Platelets: 150 10*3/uL (ref 140–400)
RBC: 4.97 10*6/uL (ref 4.20–5.80)
RDW: 12.5 % (ref 11.0–15.0)
Total Lymphocyte: 27.8 %
WBC: 4.1 10*3/uL (ref 3.8–10.8)

## 2020-06-19 LAB — COMPREHENSIVE METABOLIC PANEL
AG Ratio: 2 (calc) (ref 1.0–2.5)
ALT: 39 U/L (ref 9–46)
AST: 23 U/L (ref 10–35)
Albumin: 4.4 g/dL (ref 3.6–5.1)
Alkaline phosphatase (APISO): 63 U/L (ref 35–144)
BUN: 15 mg/dL (ref 7–25)
CO2: 27 mmol/L (ref 20–32)
Calcium: 9.3 mg/dL (ref 8.6–10.3)
Chloride: 103 mmol/L (ref 98–110)
Creat: 1.1 mg/dL (ref 0.70–1.18)
Globulin: 2.2 g/dL (calc) (ref 1.9–3.7)
Glucose, Bld: 125 mg/dL — ABNORMAL HIGH (ref 65–99)
Potassium: 4.4 mmol/L (ref 3.5–5.3)
Sodium: 142 mmol/L (ref 135–146)
Total Bilirubin: 0.7 mg/dL (ref 0.2–1.2)
Total Protein: 6.6 g/dL (ref 6.1–8.1)

## 2020-06-19 LAB — HEMOGLOBIN A1C
Hgb A1c MFr Bld: 6 % of total Hgb — ABNORMAL HIGH (ref <5.7)
Mean Plasma Glucose: 126 mg/dL
eAG (mmol/L): 7 mmol/L

## 2020-06-19 LAB — LIPID PANEL
Cholesterol: 161 mg/dL (ref <200)
HDL: 65 mg/dL (ref >=40)
LDL Cholesterol (Calc): 71 mg/dL (calc)
Non-HDL Cholesterol (Calc): 96 mg/dL (calc) (ref <130)
Total CHOL/HDL Ratio: 2.5 (calc) (ref <5.0)
Triglycerides: 177 mg/dL — ABNORMAL HIGH (ref <150)

## 2020-06-19 LAB — PSA: PSA: 0.04 ng/mL (ref <=4.0)

## 2020-06-20 ENCOUNTER — Ambulatory Visit (INDEPENDENT_AMBULATORY_CARE_PROVIDER_SITE_OTHER): Payer: Medicare Other

## 2020-06-20 DIAGNOSIS — Z0000 Encounter for general adult medical examination without abnormal findings: Secondary | ICD-10-CM

## 2020-06-20 NOTE — Patient Instructions (Signed)
Kenneth Salazar , Thank you for taking time to come for your Medicare Wellness Visit. I appreciate your ongoing commitment to your health goals. Please review the following plan we discussed and let me know if I can assist you in the future.   Screening recommendations/referrals: Colonoscopy: Done 07/25/15 Recommended yearly ophthalmology/optometry visit for glaucoma screening and checkup Recommended yearly dental visit for hygiene and checkup  Vaccinations: Influenza vaccine: Done 03/14/20 Up to date Pneumococcal vaccine: Up to date Tdap vaccine: Up to date Shingles vaccine: Completed 08/04/18 & 01/03/19   Covid-19: Completed 2/4, 3/1, & 04/09/20  Advanced directives: Copies in chart  Conditions/risks identified: lose 2lbs   Next appointment: Follow up in one year for your annual wellness visit.   Preventive Care 66 Years and Older, Male Preventive care refers to lifestyle choices and visits with your health care provider that can promote health and wellness. What does preventive care include?  A yearly physical exam. This is also called an annual well check.  Dental exams once or twice a year.  Routine eye exams. Ask your health care provider how often you should have your eyes checked.  Personal lifestyle choices, including:  Daily care of your teeth and gums.  Regular physical activity.  Eating a healthy diet.  Avoiding tobacco and drug use.  Limiting alcohol use.  Practicing safe sex.  Taking low doses of aspirin every day.  Taking vitamin and mineral supplements as recommended by your health care provider. What happens during an annual well check? The services and screenings done by your health care provider during your annual well check will depend on your age, overall health, lifestyle risk factors, and family history of disease. Counseling  Your health care provider may ask you questions about your:  Alcohol use.  Tobacco use.  Drug use.  Emotional  well-being.  Home and relationship well-being.  Sexual activity.  Eating habits.  History of falls.  Memory and ability to understand (cognition).  Work and work Statistician. Screening  You may have the following tests or measurements:  Height, weight, and BMI.  Blood pressure.  Lipid and cholesterol levels. These may be checked every 5 years, or more frequently if you are over 34 years old.  Skin check.  Lung cancer screening. You may have this screening every year starting at age 61 if you have a 30-pack-year history of smoking and currently smoke or have quit within the past 15 years.  Fecal occult blood test (FOBT) of the stool. You may have this test every year starting at age 76.  Flexible sigmoidoscopy or colonoscopy. You may have a sigmoidoscopy every 5 years or a colonoscopy every 10 years starting at age 33.  Prostate cancer screening. Recommendations will vary depending on your family history and other risks.  Hepatitis C blood test.  Hepatitis B blood test.  Sexually transmitted disease (STD) testing.  Diabetes screening. This is done by checking your blood sugar (glucose) after you have not eaten for a while (fasting). You may have this done every 1-3 years.  Abdominal aortic aneurysm (AAA) screening. You may need this if you are a current or former smoker.  Osteoporosis. You may be screened starting at age 7 if you are at high risk. Talk with your health care provider about your test results, treatment options, and if necessary, the need for more tests. Vaccines  Your health care provider may recommend certain vaccines, such as:  Influenza vaccine. This is recommended every year.  Tetanus, diphtheria, and  acellular pertussis (Tdap, Td) vaccine. You may need a Td booster every 10 years.  Zoster vaccine. You may need this after age 97.  Pneumococcal 13-valent conjugate (PCV13) vaccine. One dose is recommended after age 62.  Pneumococcal  polysaccharide (PPSV23) vaccine. One dose is recommended after age 2. Talk to your health care provider about which screenings and vaccines you need and how often you need them. This information is not intended to replace advice given to you by your health care provider. Make sure you discuss any questions you have with your health care provider. Document Released: 07/26/2015 Document Revised: 03/18/2016 Document Reviewed: 04/30/2015 Elsevier Interactive Patient Education  2017 [place] Prevention in the Home Falls can cause injuries. They can happen to people of all ages. There are many things you can do to make your home safe and to help prevent falls. What can I do on the outside of my home?  Regularly fix the edges of walkways and driveways and fix any cracks.  Remove anything that might make you trip as you walk through a door, such as a raised step or threshold.  Trim any bushes or trees on the path to your home.  Use bright outdoor lighting.  Clear any walking paths of anything that might make someone trip, such as rocks or tools.  Regularly check to see if handrails are loose or broken. Make sure that both sides of any steps have handrails.  Any raised decks and porches should have guardrails on the edges.  Have any leaves, snow, or ice cleared regularly.  Use sand or salt on walking paths during winter.  Clean up any spills in your garage right away. This includes oil or grease spills. What can I do in the bathroom?  Use night lights.  Install grab bars by the toilet and in the tub and shower. Do not use towel bars as grab bars.  Use non-skid mats or decals in the tub or shower.  If you need to sit down in the shower, use a plastic, non-slip stool.  Keep the floor dry. Clean up any water that spills on the floor as soon as it happens.  Remove soap buildup in the tub or shower regularly.  Attach bath mats securely with double-sided non-slip rug  tape.  Do not have throw rugs and other things on the floor that can make you trip. What can I do in the bedroom?  Use night lights.  Make sure that you have a light by your bed that is easy to reach.  Do not use any sheets or blankets that are too big for your bed. They should not hang down onto the floor.  Have a firm chair that has side arms. You can use this for support while you get dressed.  Do not have throw rugs and other things on the floor that can make you trip. What can I do in the kitchen?  Clean up any spills right away.  Avoid walking on wet floors.  Keep items that you use a lot in easy-to-reach places.  If you need to reach something above you, use a strong step stool that has a grab bar.  Keep electrical cords out of the way.  Do not use floor polish or wax that makes floors slippery. If you must use wax, use non-skid floor wax.  Do not have throw rugs and other things on the floor that can make you trip. What can I do with my stairs?  Do not leave any items on the stairs.  Make sure that there are handrails on both sides of the stairs and use them. Fix handrails that are broken or loose. Make sure that handrails are as long as the stairways.  Check any carpeting to make sure that it is firmly attached to the stairs. Fix any carpet that is loose or worn.  Avoid having throw rugs at the top or bottom of the stairs. If you do have throw rugs, attach them to the floor with carpet tape.  Make sure that you have a light switch at the top of the stairs and the bottom of the stairs. If you do not have them, ask someone to add them for you. What else can I do to help prevent falls?  Wear shoes that:  Do not have high heels.  Have rubber bottoms.  Are comfortable and fit you well.  Are closed at the toe. Do not wear sandals.  If you use a stepladder:  Make sure that it is fully opened. Do not climb a closed stepladder.  Make sure that both sides of the  stepladder are locked into place.  Ask someone to hold it for you, if possible.  Clearly mark and make sure that you can see:  Any grab bars or handrails.  First and last steps.  Where the edge of each step is.  Use tools that help you move around (mobility aids) if they are needed. These include:  Canes.  Walkers.  Scooters.  Crutches.  Turn on the lights when you go into a dark area. Replace any light bulbs as soon as they burn out.  Set up your furniture so you have a clear path. Avoid moving your furniture around.  If any of your floors are uneven, fix them.  If there are any pets around you, be aware of where they are.  Review your medicines with your doctor. Some medicines can make you feel dizzy. This can increase your chance of falling. Ask your doctor what other things that you can do to help prevent falls. This information is not intended to replace advice given to you by your health care provider. Make sure you discuss any questions you have with your health care provider. Document Released: 04/25/2009 Document Revised: 12/05/2015 Document Reviewed: 08/03/2014 Elsevier Interactive Patient Education  2017 [employer].

## 2020-06-20 NOTE — Progress Notes (Signed)
Virtual Visit via Telephone Note  I connected with  Trisha Morandi on 06/20/20 at 11:45 AM EST by telephone and verified that I am speaking with the correct person using two identifiers.  Medicare Annual Wellness visit completed telephonically due to Covid-19 pandemic.   Persons participating in this call: This Health Coach and this patient.   Location: Patient: Home Provider: Office   I discussed the limitations, risks, security and privacy concerns of performing an evaluation and management service by telephone and the availability of in person appointments. The patient expressed understanding and agreed to proceed.  Unable to perform video visit due to video visit attempted and failed and/or patient does not have video capability.   Some vital signs may be absent or patient reported.   Willette Brace, LPN    Subjective:   Aydan Levitz is a 74 y.o. male who presents for Medicare Annual/Subsequent preventive examination.  Review of Systems     Cardiac Risk Factors include: advanced age (>24men, >49 women);dyslipidemia;male gender     Objective:    There were no vitals filed for this visit. There is no height or weight on file to calculate BMI.  Advanced Directives 06/19/2019 06/14/2018 06/07/2017 01/14/2016  Does Patient Have a Medical Advance Directive? Yes Yes Yes Yes  Type of Paramedic of [place];Living will [place];Living will [place];Living will [place];Living will;Out of facility DNR (pink MOST or yellow form)  Does patient want to make changes to medical advance directive? No - Patient declined No - Patient declined No - Patient declined No - Patient declined  Copy of [place] in Chart? Yes - validated most recent copy scanned in chart (See row information) Yes - validated most recent copy scanned in chart (See row information) Yes No - copy requested     Current Medications (verified) Outpatient Encounter Medications as of 06/20/2020  Medication Sig  . aspirin 325 MG EC tablet Take 325 mg by mouth every 6 (six) hours as needed for pain.   Marland Kitchen aspirin EC 81 MG tablet Take 81 mg by mouth daily.   . Inulin 1.5 g CHEW Chew 6 tablets by mouth daily.   . Multiple Vitamin (MULTIVITAMIN) tablet Take 1 tablet by mouth daily.   . Multiple Vitamins-Minerals (OCULAR VITAMINS PO) Take 1 tablet by mouth daily.  . pravastatin (PRAVACHOL) 20 MG tablet TAKE 1 TABLET BY MOUTH  DAILY   No facility-administered encounter medications on file as of 06/20/2020.    Allergies (verified) Sulfa antibiotics; Antihistamines, chlorpheniramine-type; Antihistamines, diphenhydramine-type; Antihistamines, loratadine-type; and Percocet [oxycodone-acetaminophen]   History: Past Medical History:  Diagnosis Date  . Arthritis   . Cancer [facility])    prostate 01-2015  . Hypertension    Past Surgical History:  Procedure Laterality Date  . HERNIA REPAIR    . PROSTATE SURGERY     reomoved 01-2015  . VASECTOMY  1977   Family History  Problem Relation Age of Onset  . Diabetes Mother   . Stroke Father   . Diabetes Maternal Uncle    Social History   Socioeconomic History  . Marital status: Married    Spouse name: Not on file  . Number of children: Not on file  . Years of education: Not on file  . Highest education level: Not on file  Occupational History  . Occupation: retired    Comment: maintenance via computer   Tobacco Use  . Smoking status: Never Smoker  .  Smokeless tobacco: Never Used  Vaping Use  . Vaping Use: Never used  Substance and Sexual Activity  . Alcohol use: Yes    Alcohol/week: 2.0 standard drinks    Types: 1 Glasses of wine, 1 Cans of beer per week    Comment: 1 beer or wine/week  . Drug use: No  . Sexual activity: Never  Other Topics Concern  . Not on file  Social History Narrative   Retired at age 75   Worked for telephone company    Married   3 children, 1 grandkid   Social Determinants of Radio broadcast assistant Strain: [place]   . Difficulty of Paying Living Expenses: Not hard at all  Food Insecurity: No Food Insecurity  . Worried About Charity fundraiser in the Last Year: Never true  . Ran Out of Food in the Last Year: Never true  Transportation Needs: No Transportation Needs  . Lack of Transportation (Medical): No  . Lack of Transportation (Non-Medical): No  Physical Activity: Sufficiently Active  . Days of Exercise per Week: 7 days  . Minutes of Exercise per Session: 30 min  Stress: No Stress Concern Present  . Feeling of Stress : Not at all  Social Connections: Moderately Integrated  . Frequency of Communication with Friends and Family: Never  . Frequency of Social Gatherings with Friends and Family: Once a week  . Attends Religious Services: More than 4 times per year  . Active Member of Clubs or Organizations: Yes  . Attends Archivist Meetings: 1 to 4 times per year  . Marital Status: Married    Tobacco Counseling Counseling given: Not Answered   Clinical Intake:  Pre-visit preparation completed: Yes  Pain : No/denies pain     BMI - recorded: 22.79 Nutritional Status: BMI of 19-24  Normal Nutritional Risks: None Diabetes: No  How often do you need to have someone help you when you read instructions, pamphlets, or other written materials from your doctor or pharmacy?: 1 - Never  Diabetic?No  Interpreter Needed?: No  Information entered by :: Charlott Rakes, LPN   Activities of Daily Living In your present state of health, do you have any difficulty performing the following activities: 06/20/2020  Hearing? Y  Vision? N  Difficulty concentrating or making decisions? Y  Walking or climbing stairs? N  Dressing or bathing? N  Doing errands, shopping? N  Preparing Food and eating ? N  Using the Toilet? N  In the past six months, have you accidently leaked urine? N   Do you have problems with loss of bowel control? N  Managing your Medications? N  Managing your Finances? N  Housekeeping or managing your Housekeeping? N  Some recent data might be hidden    Patient Care Team: [name], [place] as PCP - General (Physician Assistant) Gastroenterology, Sadie Haber as Consulting Physician (Gastroenterology) Juluis Rainier as Consulting Physician (Optometry) Dentistry, [facility] as Consulting Physician (Dentistry)  Indicate any recent Medical Services you may have received from other than [facility] providers in the past year (date may be approximate).     Assessment:   This is a routine wellness examination for [place].  Hearing/Vision screen  Hearing Screening   125Hz  250Hz  500Hz  1000Hz  2000Hz  3000Hz  4000Hz  6000Hz  8000Hz   Right ear:           Left ear:           Comments: Pt wears hearing aids  Vision Screening Comments: Pt follows  up with Dr Alois Cliche for annual eye exams  Dietary issues and exercise activities discussed: Current Exercise Habits: Home exercise routine, Type of exercise: stretching;walking, Time (Minutes): 30, Frequency (Times/Week): 7, Weekly Exercise (Minutes/Week): 210  Goals    . Maintain current health status, not gain weight.    . Patient Stated     Lose weight about 2lbs      Depression Screen PHQ 2/9 Scores 06/20/2020 06/18/2020 06/16/2019 06/14/2018 06/10/2017 06/07/2017 02/10/2017  PHQ - 2 Score 0 0 0 0 0 0 0  PHQ- 9 Score - - 0 0 3 0 -    Fall Risk Fall Risk  06/20/2020 06/18/2020 06/19/2019 06/16/2019 06/14/2018  Falls in the past year? 0 0 0 0 0  Number falls in past yr: 0 0 - 0 -  Injury with Fall? 0 0 0 0 -  Risk for fall due to : Impaired balance/gait;Impaired vision No Fall Risks - - -  Risk for fall due to: Comment hx of vertigo - - - -  Follow up Falls prevention discussed Falls evaluation completed Falls evaluation completed;Education provided;Falls prevention discussed - -    FALL RISK PREVENTION  PERTAINING TO THE HOME:  Any stairs in or around the home? Yes  If so, are there any without handrails? No  Home free of loose throw rugs in walkways, pet beds, electrical cords, etc? Yes  Adequate lighting in your home to reduce risk of falls? Yes   ASSISTIVE DEVICES UTILIZED TO PREVENT FALLS:  Life alert? No  Use of a cane, walker or w/c? No  Grab bars in the bathroom? Yes  Shower chair or bench in shower? Yes  Elevated toilet seat or a handicapped toilet? Yes   TIMED UP AND GO:  Was the test performed? No .      Cognitive Function: MMSE - Mini Mental State Exam 06/07/2017  Orientation to time 5  Orientation to Place 5  Registration 3  Attention/ Calculation 5  Recall 3  Language- name 2 objects 2  Language- repeat 1  Language- follow 3 step command 3  Language- read & follow direction 1  Write a sentence 1  Copy design 1  Total score 30     6CIT Screen 06/20/2020 06/19/2019  What Year? 0 points 0 points  What month? 0 points 0 points  What time? - 0 points  Count back from 20 0 points 0 points  Months in reverse 0 points 0 points  Repeat phrase 2 points 2 points  Total Score - 2    Immunizations Immunization History  Administered Date(s) Administered  . Fluad Quad(high Dose 65+) 03/21/2019  . Influenza, High Dose Seasonal PF 06/07/2017, 04/14/2018, 03/14/2020  . Influenza-Unspecified 04/21/2016  . PFIZER SARS-COV-2 Vaccination 08/17/2019, 09/11/2019, 04/09/2020  . Pneumococcal Conjugate-13 05/18/2014  . Pneumococcal Polysaccharide-23 01/29/2009  . Td 01/17/2008  . Tdap 06/14/2018  . Zoster 01/17/2008  . Zoster Recombinat (Shingrix) 08/04/2018, 01/03/2019    TDAP status: Up to date  Flu Vaccine status: Up to date  Pneumococcal vaccine status: Up to date  Covid-19 vaccine status: Completed vaccines  Qualifies for Shingles Vaccine? Yes   Zostavax completed Yes   Shingrix Completed?: Yes  Screening Tests Health Maintenance  Topic Date Due  .  COLONOSCOPY  07/24/2020  . COVID-19 Vaccine (4 - Booster for Pfizer series) 10/07/2020  . TETANUS/TDAP  06/14/2028  . INFLUENZA VACCINE  Completed  . Hepatitis C Screening  Completed  . PNA vac Low Risk Adult  Completed    Health Maintenance  There are no preventive care reminders to display for this patient.  Colorectal cancer screening: Type of screening: Colonoscopy. Completed 07/25/15. Repeat every 5 years  Additional Screening:  Hepatitis C Screening:  Completed 06/10/17  Vision Screening: Recommended annual ophthalmology exams for early detection of glaucoma and other disorders of the eye. Is the patient up to date with their annual eye exam?  Yes  Who is the provider or what is the name of the office in which the patient attends annual eye exams? Dr Alois Cliche  Dental Screening: Recommended annual dental exams for proper oral hygiene  Community Resource Referral / Chronic Care Management: CRR required this visit?  No   CCM required this visit?  No      Plan:     I have personally reviewed and noted the following in the patient's chart:   . Medical and social history . Use of alcohol, tobacco or illicit drugs  . Current medications and supplements . Functional ability and status . Nutritional status . Physical activity . Advanced directives . List of other physicians . Hospitalizations, surgeries, and ER visits in previous 12 months . Vitals . Screenings to include cognitive, depression, and falls . Referrals and appointments  In addition, I have reviewed and discussed with patient certain preventive protocols, quality metrics, and best practice recommendations. A written personalized care plan for preventive services as well as general preventive health recommendations were provided to patient.     Willette Brace, LPN   [id number]   Nurse Notes: None

## 2020-06-25 ENCOUNTER — Telehealth: Payer: Self-pay | Admitting: Gastroenterology

## 2020-06-25 NOTE — Telephone Encounter (Signed)
Good afternoon Dr. Bryan Lemma, we received a referral for this patient to have a colonoscopy.  Last colon was in 2017.  Records are in epic.  Please review and advise on scheduling.  Thank you.

## 2020-06-26 NOTE — Telephone Encounter (Signed)
Available records reviewed and notable for the following:  -Colonoscopy (2013): Polyps noted, but unknown size, number, location, histology -Colonoscopy (07/2015, Dr. Michail Sermon): Diverticulosis, internal hemorrhoids.  Recommended repeat colonoscopy in 5 years due to prior history of polyps  Please try to obtain copy of endoscopy report from 2013 for review.  Otherwise, based on history of polyps and previous Endoscopist's recommendation, okay to schedule for direct access colonoscopy in 07/2020.  Thank you.

## 2020-06-27 NOTE — Telephone Encounter (Signed)
Patient scheduled for 07/01/20 for pre-visit and 07/19/20 for procedure.  Asked patient if he could obtain copy of endoscopy report from 2013, but he stated he could not.

## 2020-07-01 ENCOUNTER — Ambulatory Visit (AMBULATORY_SURGERY_CENTER): Payer: Self-pay | Admitting: *Deleted

## 2020-07-01 ENCOUNTER — Other Ambulatory Visit: Payer: Self-pay

## 2020-07-01 VITALS — Ht 68.0 in | Wt 163.0 lb

## 2020-07-01 DIAGNOSIS — Z86010 Personal history of colonic polyps: Secondary | ICD-10-CM

## 2020-07-01 MED ORDER — NA SULFATE-K SULFATE-MG SULF 17.5-3.13-1.6 GM/177ML PO SOLN: ORAL | 0 refills | Status: DC

## 2020-07-01 NOTE — Progress Notes (Signed)
Patient is here in-person for PV. Patient denies any allergies to eggs or soy. Patient denies any problems with anesthesia/sedation. Patient denies any oxygen use at home. Patient denies taking any diet/weight loss medications or blood thinners. Patient is not being treated for MRSA or C-diff. Patient is aware of our care-partner policy and GLOVF-64 safety protocol. EMMI education assigned to the patient for the procedure, sent to [place].   COVID-19 vaccines completed on 04/09/20 booster, per patient.

## 2020-07-09 ENCOUNTER — Encounter: Payer: Self-pay | Admitting: Physician Assistant

## 2020-07-11 ENCOUNTER — Encounter: Payer: Self-pay | Admitting: Gastroenterology

## 2020-07-19 ENCOUNTER — Other Ambulatory Visit: Payer: Self-pay

## 2020-07-19 ENCOUNTER — Ambulatory Visit (AMBULATORY_SURGERY_CENTER): Payer: Medicare Other | Admitting: Gastroenterology

## 2020-07-19 ENCOUNTER — Encounter: Payer: Self-pay | Admitting: Gastroenterology

## 2020-07-19 VITALS — BP 151/83 | HR 75 | Temp 98.4°F | Resp 14 | Ht 68.0 in | Wt 163.0 lb

## 2020-07-19 DIAGNOSIS — K641 Second degree hemorrhoids: Secondary | ICD-10-CM

## 2020-07-19 DIAGNOSIS — D122 Benign neoplasm of ascending colon: Secondary | ICD-10-CM

## 2020-07-19 DIAGNOSIS — Z86010 Personal history of colonic polyps: Secondary | ICD-10-CM

## 2020-07-19 DIAGNOSIS — K5730 Diverticulosis of large intestine without perforation or abscess without bleeding: Secondary | ICD-10-CM

## 2020-07-19 DIAGNOSIS — K635 Polyp of colon: Secondary | ICD-10-CM | POA: Diagnosis not present

## 2020-07-19 MED ORDER — SODIUM CHLORIDE 0.9 % IV SOLN
500.0000 mL | Freq: Once | INTRAVENOUS | Status: DC
Start: 2020-07-19 — End: 2020-07-19

## 2020-07-19 NOTE — Patient Instructions (Signed)
Use a fiber supplement like Citrucel, Fibercon, Konsyl or metamucil daily.  Handout on polyps, diverticulosis and hemorrhoids given.   YOU HAD AN ENDOSCOPIC PROCEDURE TODAY AT [facility]:   Refer to the procedure report that was given to you for any specific questions about what was found during the examination.  If the procedure report does not answer your questions, please call your gastroenterologist to clarify.  If you requested that your care partner not be given the details of your procedure findings, then the procedure report has been included in a sealed envelope for you to review at your convenience later.  YOU SHOULD EXPECT: Some feelings of bloating in the abdomen. Passage of more gas than usual.  Walking can help get rid of the air that was put into your GI tract during the procedure and reduce the bloating. If you had a lower endoscopy (such as a colonoscopy or flexible sigmoidoscopy) you may notice spotting of blood in your stool or on the toilet paper. If you underwent a bowel prep for your procedure, you may not have a normal bowel movement for a few days.  Please Note:  You might notice some irritation and congestion in your nose or some drainage.  This is from the oxygen used during your procedure.  There is no need for concern and it should clear up in a day or so.  SYMPTOMS TO REPORT IMMEDIATELY:   Following lower endoscopy (colonoscopy or flexible sigmoidoscopy):  Excessive amounts of blood in the stool  Significant tenderness or worsening of abdominal pains  Swelling of the abdomen that is new, acute  Fever of 100F or higher   For urgent or emergent issues, a gastroenterologist can be reached at any hour by calling [phone]. Do not use MyChart messaging for urgent concerns.    DIET:  We do recommend a small meal at first, but then you may proceed to your regular diet.  Drink plenty of fluids but you should avoid alcoholic beverages for 24  hours.  ACTIVITY:  You should plan to take it easy for the rest of today and you should NOT DRIVE or use heavy machinery until tomorrow (because of the sedation medicines used during the test).    FOLLOW UP: Our staff will call the number listed on your records 48-72 hours following your procedure to check on you and address any questions or concerns that you may have regarding the information given to you following your procedure. If we do not reach you, we will leave a message.  We will attempt to reach you two times.  During this call, we will ask if you have developed any symptoms of COVID 19. If you develop any symptoms (ie: fever, flu-like symptoms, shortness of breath, cough etc.) before then, please call [phone].  If you test positive for Covid 19 in the 2 weeks post procedure, please call and report this information to [place].    If any biopsies were taken you will be contacted by phone or by letter within the next 1-3 weeks.  Please call us at [phone] if you have not heard about the biopsies in 3 weeks.    SIGNATURES/CONFIDENTIALITY: You and/or your care partner have signed paperwork which will be entered into your electronic medical record.  These signatures attest to the fact that that the information above on your After Visit Summary has been reviewed and is understood.  Full responsibility of the confidentiality of this discharge information lies with you  and/or your care-partner. 

## 2020-07-19 NOTE — Progress Notes (Signed)
To PACU, VSS. Report to [email or web address] 

## 2020-07-19 NOTE — Progress Notes (Signed)
Called to room to assist during endoscopic procedure.  Patient ID and intended procedure confirmed with present staff. Received instructions for my participation in the procedure from the performing physician.  

## 2020-07-19 NOTE — Op Note (Signed)
[facility] Patient Name: Kenneth Salazar Procedure Date: 07/19/2020 8:47 AM MRN: [id number] Endoscopist: Gerrit Heck , MD Age: 75 Referring MD:  Date of Birth: [birth year] Gender: Male Account #: [id number] Procedure:                Colonoscopy Indications:              Surveillance: Personal history of colonic polyps                            (unknown histology) on last colonoscopy 5 years ago                           ?"Colonoscopy (2013): Polyps noted, but unknown                            size, number, location, histology                           ?"Colonoscopy (07/2015, Dr. Michail Sermon):                            Diverticulosis, internal hemorrhoids. Recommended                            repeat colonoscopy in 5 years due to prior history                            of polyps Medicines:                Monitored Anesthesia Care Procedure:                Pre-Anesthesia Assessment:                           - Prior to the procedure, a History and Physical                            was performed, and patient medications and                            allergies were reviewed. The patient's tolerance of                            previous anesthesia was also reviewed. The risks                            and benefits of the procedure and the sedation                            options and risks were discussed with the patient.                            All questions were answered, and informed consent                            was obtained.  Prior Anticoagulants: The patient has                            taken no previous anticoagulant or antiplatelet                            agents. ASA Grade Assessment: II - A patient with                            mild systemic disease. After reviewing the risks                            and benefits, the patient was deemed in                            satisfactory condition to undergo the procedure.                            After obtaining informed consent, the colonoscope                            was passed under direct vision. Throughout the                            procedure, the patient's blood pressure, pulse, and                            oxygen saturations were monitored continuously. The                            Olympus [id number]) Colonoscope was                            introduced through the anus and advanced to the the                            terminal ileum. The colonoscopy was performed                            without difficulty. The patient tolerated the                            procedure well. The quality of the bowel                            preparation was good. The terminal ileum, ileocecal                            valve, appendiceal orifice, and rectum were                            photographed. Scope In: 8:52:21 AM Scope Out: 9:07:57 AM Scope Withdrawal Time: 0 hours 13 minutes 21 seconds  Total Procedure Duration: 0 hours 15 minutes 36 seconds  Findings:                 Hemorrhoids were found on perianal exam.                           A 5 mm polyp was found in the ascending colon. The                            polyp was sessile. The polyp was removed with a                            cold snare. Resection and retrieval were complete.                            Estimated blood loss was minimal.                           Multiple small-mouthed diverticula were found in                            the sigmoid colon.                           Non-bleeding internal hemorrhoids were found during                            retroflexion. The hemorrhoids were medium-sized and                            Grade II (internal hemorrhoids that prolapse but                            reduce spontaneously).                           The terminal ileum appeared normal. Complications:            No immediate complications. Estimated Blood Loss:     Estimated blood loss was  minimal. Impression:               - Hemorrhoids found on perianal exam.                           - One 5 mm polyp in the ascending colon, removed                            with a cold snare. Resected and retrieved.                           - Diverticulosis in the sigmoid colon.                           - Non-bleeding internal hemorrhoids.                           - The examined portion of the ileum was normal. Recommendation:           -  Patient has a contact number available for                            emergencies. The signs and symptoms of potential                            delayed complications were discussed with the                            patient. Return to normal activities tomorrow.                            Written discharge instructions were provided to the                            patient.                           - Resume previous diet.                           - Continue present medications.                           - Await pathology results.                           - Repeat colonoscopy for surveillance based on                            pathology results.                           - Use fiber, for example Citrucel, Fibercon, Konsyl                            or Metamucil.                           - Internal hemorrhoids were noted on this study and                            may be amenable to hemorrhoid band ligation. If you                            are interested in further treatment of these                            hemorrhoids with band ligation, please contact my                            clinic to set up an appointment for evaluation and                            treatment. Gerrit Heck, MD 07/19/2020 9:13:14 AM

## 2020-07-19 NOTE — Progress Notes (Signed)
Pt's states no medical or surgical changes since previsit or office visit.  VS SM 

## 2020-07-23 ENCOUNTER — Telehealth: Payer: Self-pay

## 2020-07-23 NOTE — Telephone Encounter (Signed)
  Follow up Call-  Call back number 07/19/2020  Post procedure Call Back phone  # [phone]  Permission to leave phone message Yes  Some recent data might be hidden     Patient questions:  Do you have a fever, pain , or abdominal swelling? No. Pain Score  0 *  Have you tolerated food without any problems? Yes.    Have you been able to return to your normal activities? Yes.    Do you have any questions about your discharge instructions: Diet   No. Medications  No. Follow up visit  No.  Do you have questions or concerns about your Care? No.  Actions: * If pain score is 4 or above: No action needed, pain <4.  1. Have you developed a fever since your procedure? no  2.   Have you had an respiratory symptoms (SOB or cough) since your procedure? no  3.   Have you tested positive for COVID 19 since your procedure no  4.   Have you had any family members/close contacts diagnosed with the COVID 19 since your procedure?  no   If yes to any of these questions please route to Joylene John, RN and Joella Prince, RN

## 2020-08-02 ENCOUNTER — Encounter: Payer: Self-pay | Admitting: Gastroenterology

## 2020-12-16 ENCOUNTER — Encounter: Payer: Self-pay | Admitting: Physician Assistant

## 2020-12-16 NOTE — Telephone Encounter (Signed)
Please call pt and schedule an appt for Knee pain. He has to see [place] first before referral can be placed.

## 2020-12-17 NOTE — Telephone Encounter (Signed)
Patient has been scheduled

## 2020-12-19 ENCOUNTER — Other Ambulatory Visit: Payer: Self-pay

## 2020-12-19 ENCOUNTER — Ambulatory Visit (INDEPENDENT_AMBULATORY_CARE_PROVIDER_SITE_OTHER): Payer: Medicare Other | Admitting: Family Medicine

## 2020-12-19 ENCOUNTER — Encounter: Payer: Self-pay | Admitting: Family Medicine

## 2020-12-19 VITALS — BP 134/78 | HR 83 | Temp 98.4°F | Ht 68.0 in | Wt 164.6 lb

## 2020-12-19 DIAGNOSIS — M25561 Pain in right knee: Secondary | ICD-10-CM | POA: Diagnosis not present

## 2020-12-19 DIAGNOSIS — R030 Elevated blood-pressure reading, without diagnosis of hypertension: Secondary | ICD-10-CM | POA: Diagnosis not present

## 2020-12-19 DIAGNOSIS — G8929 Other chronic pain: Secondary | ICD-10-CM | POA: Diagnosis not present

## 2020-12-19 NOTE — Progress Notes (Signed)
Phone [phone] In person visit   Subjective:   Kenneth Salazar is a 75 y.o. year old very pleasant male patient who presents for/with See problem oriented charting Chief Complaint  Patient presents with   Knee Pain    Right knee, On going for the past couple of months.    This visit occurred during the SARS-CoV-2 public health emergency.  Safety protocols were in place, including screening questions prior to the visit, additional usage of staff PPE, and extensive cleaning of exam room while observing appropriate contact time as indicated for disinfecting solutions.   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     aspirin EC 81 MG tablet Take 81 mg by mouth daily.      Multiple Vitamin (MULTIVITAMIN) tablet Take 1 tablet by mouth daily.      Multiple Vitamins-Minerals (PRESERVISION AREDS PO) Take by mouth.     pravastatin (PRAVACHOL) 20 MG tablet TAKE 1 TABLET BY MOUTH  DAILY 90 tablet 3   Psyllium (METAMUCIL MULTIHEALTH FIBER PO)      No current facility-administered medications for this visit.     Objective:  BP (!) 180/90   Pulse 83   Temp 98.4 F (36.9 C) (Temporal)   Ht 5\' 8"  (1.727 m)   Wt 164 lb 9.6 oz (74.7 kg)   SpO2 98%   BMI 25.03 kg/m   CV: RRR  Lungs: nonlabored, normal respiratory rate Abdomen: soft/nondistended   Left Knee: Normal to inspection with no erythema or effusion or obvious  bony abnormalities. Palpation normal with no warmth or joint line tenderness or patellar tenderness or condyle tenderness. ROM normal in flexion and extension and lower leg rotation. Ligaments with solid consistent endpoints including ACL, PCL, LCL, MCL. Negative Mcmurray's and provocative meniscal tests. Non painful patellar compression. Patellar and quadriceps tendons unremarkable. Hamstring and quadriceps strength is normal.  Right Knee: Pain in lower portion of quadriceps with palpation pain with palpation in upper portion of right lower leg lateral region some pain in quadriceps with resistance no effusion Ligaments with solid consistent endpoints including ACL, PCL, LCL, MCL Negative McMurray's     Assessment and Plan  # Chronic Right Knee Pain S: a couple of months ago- 2-3 months- he tried to put his sock on one morning and felt like his knee was overstretched. denies any edema or erythema. When he stands up he feels like he has to catch himself and feels unbalanced at times.  Patient has not tried any medication for this yet other than a muscle rub which was slightly beneficial-he prefers to avoid medication if possible.  He did place a ice bag on it to help him ease some pain. Compression sleeve on the right knee has been the most helpful for him.  He reports has had issues with his right knee in the past-has had x-rays which were unrevealing.  A/P: Pain just above her right knee on exam as well as  some pain in the lateral portions of upper portions of right lower leg (with reported right lateral knee pain)-could be a lower quadriceps strain or have some tearing of the quadriceps tendon suggested that he  benefit from  three options: 1. [facility] evaluation with Dr. Georgina Snell- he opts for this option  2.  Referral to physical therapy for PT exercises-he declines for now 3. 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 BP Readings from Last 3 Encounters:  12/19/20 (!) 180/90  07/19/20 (!) 151/83  06/18/20 138/82  A/P: I encouraged patient to do some home monitoring and update [place] within a few days-since he reports prior home readings in the 1 30-1 40 range-if average less than 135/85 likely continue without medication  Recommended follow up: No follow-ups on file. Future Appointments  Date Time Provider [place]  06/20/2021  9:30 AM [name], [place] [facility]  06/26/2021 11:00 AM [facility]    Lab/Order associations:   ICD-10-CM   1. Chronic pain of right knee  M25.561 Ambulatory referral to Sports Medicine   G89.29     2. White coat syndrome with high blood pressure but without hypertension  R03.0       Time Spent: 23 minutes of total time (5 PM-5:23 PM) was spent on the date of the encounter performing the following actions: chart review prior to seeing the patient, obtaining history, performing a medically necessary exam, counseling on the treatment plan as well as options, placing orders, and documenting in our EHR.   I,Harris Phan,acting as a Education administrator for Garret Reddish, MD.,have documented all relevant documentation on the behalf of Garret Reddish, MD,as directed by  Garret Reddish, MD while in the presence of Garret Reddish, MD.   I, Garret Reddish, MD, have reviewed all documentation for this visit. The documentation on 12/19/20 for the exam, diagnosis, procedures, and orders are all accurate and complete.   Return precautions advised.  Garret Reddish, MD

## 2020-12-19 NOTE — Patient Instructions (Addendum)
Please monitor home blood pressure and update [place]  with goal <135/85 on average at home. Can either give [place] some old #s or check over next few days or combo of these.   We will call you within two weeks about your referral to Dr. Georgina Snell of sports medicine. If you do not hear within 2 weeks, give [place] a call.

## 2020-12-20 ENCOUNTER — Encounter: Payer: Self-pay | Admitting: Family Medicine

## 2020-12-25 ENCOUNTER — Other Ambulatory Visit: Payer: Self-pay

## 2020-12-25 ENCOUNTER — Ambulatory Visit (INDEPENDENT_AMBULATORY_CARE_PROVIDER_SITE_OTHER): Payer: Medicare Other

## 2020-12-25 ENCOUNTER — Ambulatory Visit: Payer: Self-pay

## 2020-12-25 ENCOUNTER — Ambulatory Visit: Payer: Medicare Other

## 2020-12-25 ENCOUNTER — Ambulatory Visit (INDEPENDENT_AMBULATORY_CARE_PROVIDER_SITE_OTHER): Payer: Medicare Other | Admitting: Family Medicine

## 2020-12-25 VITALS — BP 168/94 | HR 101 | Ht 68.0 in | Wt 164.0 lb

## 2020-12-25 DIAGNOSIS — M25561 Pain in right knee: Secondary | ICD-10-CM | POA: Diagnosis not present

## 2020-12-25 NOTE — Progress Notes (Signed)
Subjective:    CC: Right knee pain  I, Wendy Poet, LAT, ATC, am serving as scribe for Dr. [name].  HPI: Pt is a 75 y/o male presenting w/ chronic R knee pain that flared up about 2-3 months prior.  He locates his pain to the anterior aspect of R knee. Pt reports he bent his knee up one morning and experienced a pulling sensation of pain across the anterior aspect of knee.  R knee swelling: no R knee mechanical symptoms: no Aggravating factors: knee flexion Treatments tried: knee sleeve; ice; topical pain-relieving rub;   Pertinent review of Systems: No fevers or chills  Relevant historical information: History of prostate cancer   Objective:    Vitals:   12/25/20 1317  BP: (!) 168/94  Pulse: (!) 101  SpO2: 98%   General: Well Developed, well nourished, and in no acute distress.   MSK: Right knee normal-appearing normal motion with crepitation. Nontender to palpation. Stable ligamentous exam. Positive medial and lateral McMurray's test. Intact strength.  Lab and Radiology Results  Procedure: Real-time Ultrasound Guided Injection of right knee superior lateral patellar space Device: Philips Affiniti 50G Images permanently stored and available for review in PACS Ultrasound evaluation prior to injection reveals a small joint effusion.  Intact quad and patellar tendon. Degenerative appearing lateral meniscus with partial tear. Degenerative appearing medial meniscus with DJD.  Partially extruded medial meniscus with tear present. No Baker's cyst. Verbal informed consent obtained.  Discussed risks and benefits of procedure. Warned about infection bleeding damage to structures skin hypopigmentation and fat atrophy among others. Patient expresses understanding and agreement Time-out conducted.   Noted no overlying erythema, induration, or other signs of local infection.   Skin prepped in a sterile fashion.   Local anesthesia: Topical Ethyl chloride.   With  sterile technique and under real time ultrasound guidance: 40 mg of Kenalog and 2 mL of Marcaine injected into knee joint. Fluid seen entering the joint capsule.   Completed without difficulty   Pain immediately resolved suggesting accurate placement of the medication.   Advised to call if fevers/chills, erythema, induration, drainage, or persistent bleeding.   Images permanently stored and available for review in the ultrasound unit.  Impression: Technically successful ultrasound guided injection.    X-ray images right knee obtained today personally and independently interpreted Mild degenerative changes.  No acute fractures. Abnormal appearance medial femoral condyle.  Decreased bone mineral density versus unusual appearance overlapping soft tissue Await formal radiology review   Impression and Recommendations:    Assessment and Plan: 75 y.o. male with right knee pain thought to be due to exacerbation of DJD/chondromalacia patella.  Plan for steroid injection.  Await radiology over read x-ray..  Additionally Voltaren gel.  Recheck in 6 weeks.  PDMP not reviewed this encounter. Orders Placed This Encounter  Procedures   [place] LIMITED JOINT SPACE STRUCTURES LOW RIGHT(NO LINKED CHARGES)    Standing Status:   Future    Number of Occurrences:   1    Standing Expiration Date:   06/26/2021    Order Specific Question:   Reason for Exam (SYMPTOM  OR DIAGNOSIS REQUIRED)    Answer:   right knee pain    Order Specific Question:   Preferred imaging location?    Answer:   [facility]   DG Knee AP/LAT W/Sunrise Right    Standing Status:   Future    Number of Occurrences:   1    Standing Expiration Date:  12/25/2021    Order Specific Question:   Reason for Exam (SYMPTOM  OR DIAGNOSIS REQUIRED)    Answer:   right knee pain    Order Specific Question:   Preferred imaging location?    Answer:   [facility]   No orders of the defined types were placed in this  encounter.   Discussed warning signs or symptoms. Please see discharge instructions. Patient expresses understanding.   The above documentation has been reviewed and is accurate and complete [name], M.D.

## 2020-12-25 NOTE — Patient Instructions (Addendum)
Thank you for coming in today.   Please get an Xray today before you leave.  Please use Voltaren gel (Generic Diclofenac Gel) up to 4x daily for pain as needed.  This is available over-the-counter as both the name brand Voltaren gel and the generic diclofenac gel.    Recheck in 6 weeks.   Let me know if you are not doing well.

## 2020-12-26 ENCOUNTER — Telehealth: Payer: Self-pay

## 2020-12-26 NOTE — Telephone Encounter (Signed)
Diane at [facility] called around 9:20, 12/26/20, requesting that you take a look at [facility] knee XR report. MRN: [id number] as soon as possible.

## 2020-12-26 NOTE — Progress Notes (Signed)
X-ray right knee shows mild arthritis changes.  They can see where I did a knee injection as well.  That is the little bit of air in the knee joint.

## 2020-12-26 NOTE — Telephone Encounter (Signed)
Reviewed.  Air seen on x-ray thought to be due to knee injection and not due to infection.

## 2021-01-30 NOTE — Progress Notes (Signed)
   I, Peterson Lombard, LAT, ATC acting as a scribe for Kenneth Leader, MD.  Kenneth Salazar is a 75 y.o. male who presents to [place] at [facility] today for f/u of R anterior knee pain likely due to exacerbation of DJD/chondromalacia patella.  He was last seen by Dr. Georgina Snell on 12/25/20 and had a R knee injection and was advised to use Voltaren gel.  Since his last visit, pt reports knee is better, but not perfect, 60% improvement. Pt locates pain to the lateral aspect, w/ intermittent pain all over the knee.  Diagnostic imaging: R knee XR- 12/25/20  Pertinent review of systems: No fevers or chills  Relevant historical information: History of prostate cancer   Exam:  BP (!) 160/98   Pulse 81   Ht 5\' 8"  (1.727 m)   Wt 163 lb 3.2 oz (74 kg)   SpO2 97%   BMI 24.81 kg/m  General: Well Developed, well nourished, and in no acute distress.   MSK: Right knee normal-appearing nontender normal motion.    Lab and Radiology Results  EXAM: RIGHT KNEE 3 VIEWS   COMPARISON:  None.   FINDINGS: Frontal, lateral, and sunrise patellar images obtained. No appreciable fracture, dislocation, or effusion. There is no appreciable disc space narrowing. There is spurring along the anterior and posterior aspects of the patella. There is a focus of apparent soft tissue air in the suprapatellar bursa.   IMPRESSION: Small focus of apparent air in the suprapatellar bursa region. Question recent trauma in this area. Infection in this area could present in this manner. No associated pleural effusion, however.   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 No appreciable joint space narrowing. No fracture or dislocation.   These results will be called to the ordering clinician or representative by the Radiologist Assistant, and communication documented in the PACS or [employer].     Electronically Signed   By: Lowella Grip III M.D.   On: 12/26/2020 09:17 I,  Kenneth Salazar, personally (independently) visualized and performed the interpretation of the images attached in this note. Of note this x-ray was obtained following an injection and the small focus of air was due to the injection     Assessment and Plan: 75 y.o. male with right knee pain due to DJD and probable degenerative meniscus tear.  Significantly improved with steroid injection.  After discussion of options plan for watchful waiting with repeat steroid injection in the future if needed.  Certainly could proceed with hyaluronic acid injection if the steroid injections do not last in the future.  He is happy with the plan.  Also use Voltaren gel as needed.  Check back as needed.    Discussed warning signs or symptoms. Please see discharge instructions. Patient expresses understanding.   The above documentation has been reviewed and is accurate and complete Kenneth Salazar, M.D. Total encounter time 20 minutes including face-to-face time with the patient and, reviewing past medical record, and charting on the date of service.   Treatment plan and options

## 2021-01-31 ENCOUNTER — Ambulatory Visit (INDEPENDENT_AMBULATORY_CARE_PROVIDER_SITE_OTHER): Payer: Medicare Other | Admitting: Family Medicine

## 2021-01-31 ENCOUNTER — Other Ambulatory Visit: Payer: Self-pay

## 2021-01-31 VITALS — BP 160/98 | HR 81 | Ht 68.0 in | Wt 163.2 lb

## 2021-01-31 DIAGNOSIS — M25561 Pain in right knee: Secondary | ICD-10-CM

## 2021-01-31 NOTE — Patient Instructions (Signed)
Thank you for coming in today.   Continue activity as tolerated.   I can do the injection again as early as mid September.   I can do gel shots if needed.   I need some warning if you want the gel shots.It take a few weeks to get them authorized.   Recheck as needed.

## 2021-04-01 ENCOUNTER — Encounter: Payer: Self-pay | Admitting: Physician Assistant

## 2021-06-19 NOTE — Progress Notes (Signed)
Subjective:    Kenneth Salazar is a 75 y.o. male and is here for a comprehensive physical exam.   HPI  There are no preventive care reminders to display for this patient.  Acute Concerns: Cough Kenneth Salazar reports he has been experiencing a cough that transitions into post nasal drip into his throat for about a week. States this worsens during meals of any kind and has led him to not going out as much. Initially he thought it to be allergies due to nature of sx and the fact that it wasn't worsening. In an effort to manage his sx he trialing a couple of antihistamines but found he could not tolerate the medication. Denies fever, chills, or sore throat.   Skin Irritation Pt has noticed that upon the weather getting colder and the heat being on that his skin has become irritated. Pt describes his skin as irritated and dry , but not excessively itchy. In an effort he has been using Eucerin topical cream daily which has provided relief. He is not interested in any form of medication intervention for this issue.   Chronic Issues: Insulin Resistance Blood sugars at home are: 116- 143, noticing that these elevate upon taking antihistamines. Patient is not on any medication for this issue, instead participating in exercise and healthy dietary choices. Denies: hypoglycemic or hyperglycemic episodes or symptoms.   Lab Results  Component Value Date   HGBA1C 6.0 (H) 06/18/2020    HTN Currently taking no medications. At home blood pressure readings are: Kenneth Salazar ranging 694-503 and diastolic ranging 88-82.  Patient denies chest pain, SOB, blurred vision, dizziness, unusual headaches, lower leg swelling. Denies excessive caffeine intake, stimulant usage, excessive alcohol intake, or increase in salt consumption.  BP Readings from Last 3 Encounters:  06/20/21 (!) 172/87  01/31/21 (!) 160/98  12/25/20 (!) 168/94   HLD Kenneth Salazar is currently compliant with taking pravastatin 20 mg daily and aspirin EC 81  mg daily with no adverse effects. He is managing well.   Frequent Urination Kenneth Salazar states although he is still getting up about 6 times a night to use the restroom, he is not interested in an referral to urology. Despite this, he is agreeable that if his PSA levels are elevated then he is willing to follow up with urology as needed. Pt does not have a prostate at this time.   Health Maintenance: Immunizations -- Covid- UTD Influenza-UTD Tdap- CMK;3491 Colonoscopy -- UTD;2022 PSA --  Lab Results  Component Value Date   PSA 0.04 06/18/2020   PSA 0.00 (L) 06/16/2019   PSA 0.00 (L) 06/15/2018   Diet -- Eats all food groups, keeping an eye on carb intake Ophthalmology- UTD Dentistry- UTD Sleep habits -- Normal schedule  Exercise -- Walks 20 minutes a day Weight -- Stable Recent weight history Wt Readings from Last 10 Encounters:  06/20/21 162 lb 4 oz (73.6 kg)  01/31/21 163 lb 3.2 oz (74 kg)  12/25/20 164 lb (74.4 kg)  12/19/20 164 lb 9.6 oz (74.7 kg)  07/19/20 163 lb (73.9 kg)  07/01/20 163 lb (73.9 kg)  06/18/20 161 lb 6.1 oz (73.2 kg)  06/16/19 151 lb (68.5 kg)  06/14/18 153 lb 12.8 oz (69.8 kg)  06/14/18 153 lb 12.8 oz (69.8 kg)   Body mass index is 23.96 kg/m. Mood -- Stable Alcohol use --  reports current alcohol use.  Tobacco use --  Tobacco Use: Low Risk    Smoking Tobacco Use: Never   Smokeless Tobacco Use:  Never   Passive Exposure: Not on file     Depression screen [facility] 2/9 06/20/2021  Decreased Interest 0  Down, Depressed, Hopeless 1  PHQ - 2 Score 1  Altered sleeping 0  Tired, decreased energy 1  Change in appetite 0  Feeling bad or failure about yourself  0  Trouble concentrating 1  Moving slowly or fidgety/restless 0  Suicidal thoughts 0  PHQ-9 Score 3  Difficult doing work/chores Not difficult at all    Other providers/specialists: Patient Care Team: [name], [place] as PCP - General (Physician Assistant) Gastroenterology, Sadie Haber as Consulting  Physician (Gastroenterology) Juluis Rainier as Consulting Physician (Optometry) Dentistry, [facility] as Consulting Physician (Dentistry)    PMHx, SurgHx, SocialHx, Medications, and Allergies were reviewed in the Visit Navigator and updated as appropriate.   Past Medical History:  Diagnosis Date   Allergy 2005   Arthritis    Cancer [facility])    prostate 01-2015   Hyperlipidemia    Hypertension    no meds     Past Surgical History:  Procedure Laterality Date   COLONOSCOPY  2013, 07/2015   Dr.Schooler   fatty tissue removed chin     HERNIA REPAIR     MINOR EXCISION OF ORAL LESION     PROSTATE SURGERY     reomoved 01-2015   VASECTOMY  1977     Family History  Problem Relation Age of Onset   Diabetes Mother    Hearing loss Mother    Heart disease Mother    Hypertension Mother    Obesity Mother    Stroke Father    Diabetes Maternal Uncle    Colon cancer Neg Hx    Colon polyps Neg Hx    Esophageal cancer Neg Hx    Rectal cancer Neg Hx    Stomach cancer Neg Hx     Social History   Tobacco Use   Smoking status: Never   Smokeless tobacco: Never  Vaping Use   Vaping Use: Never used  Substance Use Topics   Alcohol use: Yes    Comment: Rarely   Drug use: No    Review of Systems:   Review of Systems  Constitutional:  Negative for chills, fever, malaise/fatigue and weight loss.  HENT:  Negative for hearing loss, sinus pain and sore throat.   Respiratory:  Negative for cough and hemoptysis.   Cardiovascular:  Negative for chest pain, palpitations, leg swelling and PND.  Gastrointestinal:  Negative for abdominal pain, constipation, diarrhea, heartburn, nausea and vomiting.  Genitourinary:  Negative for dysuria, frequency and urgency.  Musculoskeletal:  Negative for back pain, myalgias and neck pain.  Skin:  Negative for itching and rash.  Neurological:  Negative for dizziness, tingling, seizures and headaches.  Endo/Heme/Allergies:  Negative for  polydipsia.  Psychiatric/Behavioral:  Negative for depression. The patient is not nervous/anxious.    Objective:    Vitals:   06/20/21 0920  BP: (!) 172/87  Pulse: 80  Temp: (!) 97.5 F (36.4 C)  SpO2: 99%   Body mass index is 23.96 kg/m.  General  Alert, cooperative, no distress, appears stated age  Head:  Normocephalic, without obvious abnormality, atraumatic  Eyes:  PERRL, conjunctiva/corneas clear, EOM's intact, fundi benign, both eyes       Ears:  Normal TM's and external ear canals, both ears  Nose: Nares normal, septum midline, mucosa normal, no drainage or sinus tenderness  Throat: Lips, mucosa, and tongue normal; teeth and gums normal  Neck: Supple,  symmetrical, trachea midline, no adenopathy;     thyroid:  No enlargement/tenderness/nodules; no carotid bruit or JVD  Back:   Symmetric, no curvature, ROM normal, no CVA tenderness  Lungs:   Clear to auscultation bilaterally, respirations unlabored  Chest wall:  No tenderness or deformity  Heart:  Regular rate and rhythm, S1 and S2 normal, no murmur, rub or gallop  Abdomen:   Soft, non-tender, bowel sounds active all four quadrants, no masses, no organomegaly  Extremities: Extremities normal, atraumatic, no cyanosis or edema  Prostate : Deferred   Skin: Skin color, texture, turgor normal, no rashes or lesions  Lymph nodes: Cervical, supraclavicular, and axillary nodes normal  Neurologic: CNII-XII grossly intact. Normal strength, sensation and reflexes throughout   AssessmentPlan:   Routine Physical Examination Today patient counseled on age appropriate routine health concerns for screening and prevention, each reviewed and up to date or declined. Immunizations reviewed and up to date or declined. Labs ordered and reviewed. Risk factors for depression reviewed and negative. Hearing function and visual acuity are intact. ADLs screened and addressed as needed. Functional ability and level of safety reviewed and appropriate.  Education, counseling and referrals performed based on assessed risks today. Patient provided with a copy of personalized plan for preventive services.  Insulin resistance Update A1c today  Follow up as indicated by lab results  Hypertension, unspecified type Uncontrolled; has been on lisinopril in the remote past and tolerated well Restart lisinopril 10 mg daily  Continue monitoring BP at home regularly I advised patient that if BP reads consistenly >150/100 to reach out to office and medication will be adjusted accordingly Follow up in 3 months, sooner if concerns occur  Malignant neoplasm of prostate ([place]) Update PSA today Patient declined further intervention, unless labs are abnormal  Will potentially refer to Urology based on lab results  Hyperlipidemia, unspecified hyperlipidemia type Update labs today, adjust medication as indicated by lab results   -Lipid Profile Maintain pravastatin 20 mg daily  Encouraged continuation of daily exercise and healthy eating   Skin concerns No red flags Offered possibly low dose gabapentin however he declined Continue to monitor  PND Uncontrolled Start atrovent nasal spray Follow-up as needed    Patient Counseling: [x]   Nutrition: Stressed importance of moderation in sodium/caffeine intake, saturated fat and cholesterol, caloric balance, sufficient intake of fresh fruits, vegetables, and fiber  [x]   Stressed the importance of regular exercise.   []   Substance Abuse: Discussed cessation/primary prevention of tobacco, alcohol, or other drug use; driving or other dangerous activities under the influence; availability of treatment for abuse.   [x]   Injury prevention: Discussed safety belts, safety helmets, smoke detector, smoking near bedding or upholstery.   []   Sexuality: Discussed sexually transmitted diseases, partner selection, use of condoms, avoidance of unintended pregnancy  and contraceptive alternatives.   [x]   Dental health:  Discussed importance of regular tooth brushing, flossing, and dental visits.  [x]   Health maintenance and immunizations reviewed. Please refer to Health maintenance section.   I,Havlyn C Ratchford,acting as a Education administrator for [employer], PA.,have documented all relevant documentation on the behalf of [name], PA,as directed by  [name], PA while in the presence of [name], [place].  I, [name], [place], have reviewed all documentation for this visit. The documentation on 06/20/21 for the exam, diagnosis, procedures, and orders are all accurate and complete.   [name], PA-C [place]

## 2021-06-20 ENCOUNTER — Ambulatory Visit (INDEPENDENT_AMBULATORY_CARE_PROVIDER_SITE_OTHER): Payer: Medicare Other | Admitting: Physician Assistant

## 2021-06-20 ENCOUNTER — Encounter: Payer: Self-pay | Admitting: Physician Assistant

## 2021-06-20 ENCOUNTER — Other Ambulatory Visit: Payer: Self-pay

## 2021-06-20 VITALS — BP 172/87 | HR 80 | Temp 97.5°F | Ht 69.0 in | Wt 162.2 lb

## 2021-06-20 DIAGNOSIS — C61 Malignant neoplasm of prostate: Secondary | ICD-10-CM | POA: Diagnosis not present

## 2021-06-20 DIAGNOSIS — E8881 Metabolic syndrome: Secondary | ICD-10-CM

## 2021-06-20 DIAGNOSIS — E785 Hyperlipidemia, unspecified: Secondary | ICD-10-CM

## 2021-06-20 DIAGNOSIS — I10 Essential (primary) hypertension: Secondary | ICD-10-CM

## 2021-06-20 DIAGNOSIS — R0982 Postnasal drip: Secondary | ICD-10-CM

## 2021-06-20 DIAGNOSIS — Z0000 Encounter for general adult medical examination without abnormal findings: Secondary | ICD-10-CM

## 2021-06-20 DIAGNOSIS — Z711 Person with feared health complaint in whom no diagnosis is made: Secondary | ICD-10-CM

## 2021-06-20 LAB — LIPID PANEL
Cholesterol: 141 mg/dL (ref 0–200)
HDL: 59 mg/dL (ref >39.00)
LDL Cholesterol: 53 mg/dL (ref 0–99)
NonHDL: 81.84
Total CHOL/HDL Ratio: 2
Triglycerides: 145 mg/dL (ref 0.0–149.0)
VLDL: 29 mg/dL (ref 0.0–40.0)

## 2021-06-20 LAB — CBC WITH DIFFERENTIAL/PLATELET
Basophils Absolute: 0 10*3/uL (ref 0.0–0.1)
Basophils Relative: 0.4 % (ref 0.0–3.0)
Eosinophils Absolute: 0 10*3/uL (ref 0.0–0.7)
Eosinophils Relative: 1 % (ref 0.0–5.0)
HCT: 45.3 % (ref 39.0–52.0)
Hemoglobin: 15 g/dL (ref 13.0–17.0)
Lymphocytes Relative: 26.9 % (ref 12.0–46.0)
Lymphs Abs: 1.3 10*3/uL (ref 0.7–4.0)
MCHC: 33.2 g/dL (ref 30.0–36.0)
MCV: 91.4 fl (ref 78.0–100.0)
Monocytes Absolute: 0.5 10*3/uL (ref 0.1–1.0)
Monocytes Relative: 11 % (ref 3.0–12.0)
Neutro Abs: 2.9 10*3/uL (ref 1.4–7.7)
Neutrophils Relative %: 60.7 % (ref 43.0–77.0)
Platelets: 149 10*3/uL — ABNORMAL LOW (ref 150.0–400.0)
RBC: 4.96 Mil/uL (ref 4.22–5.81)
RDW: 13.4 % (ref 11.5–15.5)
WBC: 4.8 10*3/uL (ref 4.0–10.5)

## 2021-06-20 LAB — COMPREHENSIVE METABOLIC PANEL
ALT: 23 U/L (ref 0–53)
AST: 19 U/L (ref 0–37)
Albumin: 4.4 g/dL (ref 3.5–5.2)
Alkaline Phosphatase: 67 U/L (ref 39–117)
BUN: 17 mg/dL (ref 6–23)
CO2: 32 mEq/L (ref 19–32)
Calcium: 9.6 mg/dL (ref 8.4–10.5)
Chloride: 103 mEq/L (ref 96–112)
Creatinine, Ser: 1.04 mg/dL (ref 0.40–1.50)
GFR: 69.05 mL/min (ref >60.00)
Glucose, Bld: 121 mg/dL — ABNORMAL HIGH (ref 70–99)
Potassium: 4.8 mEq/L (ref 3.5–5.1)
Sodium: 143 mEq/L (ref 135–145)
Total Bilirubin: 0.9 mg/dL (ref 0.2–1.2)
Total Protein: 6.6 g/dL (ref 6.0–8.3)

## 2021-06-20 LAB — PSA: PSA: 0 ng/mL — ABNORMAL LOW (ref 0.10–4.00)

## 2021-06-20 LAB — HEMOGLOBIN A1C: Hgb A1c MFr Bld: 6.3 % (ref 4.6–6.5)

## 2021-06-20 MED ORDER — LISINOPRIL 10 MG PO TABS: 10.0000 mg | ORAL_TABLET | Freq: Every day | ORAL | 1 refills | Status: DC

## 2021-06-20 MED ORDER — IPRATROPIUM BROMIDE 0.06 % NA SOLN: 2.0000 | Freq: Four times a day (QID) | NASAL | 12 refills | Status: DC

## 2021-06-20 NOTE — Patient Instructions (Addendum)
It was great to see you!  Start 10 mg lisinopril daily. Follow-up with me in 3 months -- sooner if any concerns.  Use nasal spray to see if this can help with your throat symptoms.  Please go to the lab for blood work.   Our office will call you with your results unless you have chosen to receive results via MyChart.  If your blood work is normal we will follow-up each year for physicals and as scheduled for chronic medical problems.  If anything is abnormal we will treat accordingly and get you in for a follow-up.  Take care,  Aldona Bar

## 2021-06-26 ENCOUNTER — Ambulatory Visit (INDEPENDENT_AMBULATORY_CARE_PROVIDER_SITE_OTHER): Payer: Medicare Other

## 2021-06-26 ENCOUNTER — Ambulatory Visit: Payer: Medicare Other

## 2021-06-26 ENCOUNTER — Other Ambulatory Visit: Payer: Self-pay

## 2021-06-26 DIAGNOSIS — Z0000 Encounter for general adult medical examination without abnormal findings: Secondary | ICD-10-CM | POA: Diagnosis not present

## 2021-06-26 NOTE — Progress Notes (Addendum)
Virtual Visit via Telephone Note  I connected with  Kenneth Salazar on 06/26/21 at  2:30 PM EST by telephone and verified that I am speaking with the correct person using two identifiers.  Medicare Annual Wellness visit completed telephonically due to Covid-19 pandemic.   Persons participating in this call: This Health Coach and this patient.   Location: Patient: Home Provider: Office   I discussed the limitations, risks, security and privacy concerns of performing an evaluation and management service by telephone and the availability of in person appointments. The patient expressed understanding and agreed to proceed.  Unable to perform video visit due to video visit attempted and failed and/or patient does not have video capability.   Some vital signs may be absent or patient reported.   Willette Brace, LPN   Subjective:   Kenneth Salazar is a 75 y.o. male who presents for Medicare Annual/Subsequent preventive examination.  Review of Systems           Objective:    There were no vitals filed for this visit. There is no height or weight on file to calculate BMI.  Advanced Directives 06/26/2021 06/19/2019 06/14/2018 06/07/2017 01/14/2016  Does Patient Have a Medical Advance Directive? Yes Yes Yes Yes Yes  Type of Paramedic of [place];Living will [place];Living will [place];Living will [place];Living will;Out of facility DNR (pink MOST or yellow form)  Does patient want to make changes to medical advance directive? - No - Patient declined No - Patient declined No - Patient declined No - Patient declined  Copy of [place] in Chart? Yes - validated most recent copy scanned in chart (See row information) Yes - validated most recent copy scanned in chart (See row information) Yes - validated most recent copy scanned in chart (See row information) Yes  No - copy requested    Current Medications (verified) Outpatient Encounter Medications as of 06/26/2021  Medication Sig   aspirin EC 81 MG tablet Take 81 mg by mouth daily.    ipratropium (ATROVENT) 0.06 % nasal spray Place 2 sprays into both nostrils 4 (four) times daily.   lisinopril (ZESTRIL) 10 MG tablet Take 1 tablet (10 mg total) by mouth daily.   Multiple Vitamin (MULTIVITAMIN) tablet Take 1 tablet by mouth daily.    Multiple Vitamins-Minerals (PRESERVISION AREDS PO) Take by mouth.   pravastatin (PRAVACHOL) 20 MG tablet TAKE 1 TABLET BY MOUTH  DAILY   Psyllium (METAMUCIL MULTIHEALTH FIBER PO)    [DISCONTINUED] aspirin 325 MG EC tablet Take 325 mg by mouth every 6 (six) hours as needed for pain. (Patient not taking: Reported on 06/26/2021)   No facility-administered encounter medications on file as of 06/26/2021.    Allergies (verified) Sulfa antibiotics; Antihistamines, chlorpheniramine-type; Antihistamines, diphenhydramine-type; Antihistamines, loratadine-type; and Percocet [oxycodone-acetaminophen]   History: Past Medical History:  Diagnosis Date   Allergy 2005   Arthritis    Cancer [facility])    prostate 01-2015   Hyperlipidemia    Hypertension    no meds   Past Surgical History:  Procedure Laterality Date   COLONOSCOPY  2013, 07/2015   Dr.Schooler   fatty tissue removed chin     HERNIA REPAIR     MINOR EXCISION OF ORAL LESION     PROSTATE SURGERY     reomoved 01-2015   VASECTOMY  1977   Family History  Problem Relation Age of Onset   Diabetes Mother  Hearing loss Mother    Heart disease Mother    Hypertension Mother    Obesity Mother    Stroke Father    Diabetes Maternal Uncle    Colon cancer Neg Hx    Colon polyps Neg Hx    Esophageal cancer Neg Hx    Rectal cancer Neg Hx    Stomach cancer Neg Hx    Social History   Socioeconomic History   Marital status: Married    Spouse name: Not on file   Number of children: Not on file   Years of education:  Not on file   Highest education level: Not on file  Occupational History   Occupation: retired    Comment: maintenance via computer   Tobacco Use   Smoking status: Never   Smokeless tobacco: Never  Vaping Use   Vaping Use: Never used  Substance and Sexual Activity   Alcohol use: Yes    Comment: Rarely   Drug use: No   Sexual activity: Not Currently  Other Topics Concern   Not on file  Social History Narrative   Retired at age 40   Worked for telephone company   Married   3 children, 1 grandkid   Social Determinants of Radio broadcast assistant Strain: Low Risk    Difficulty of Paying Living Expenses: Not hard at all  Food Insecurity: No Food Insecurity   Worried About Charity fundraiser in the Last Year: Never true   Arboriculturist in the Last Year: Never true  Transportation Needs: No Transportation Needs   Lack of Transportation (Medical): No   Lack of Transportation (Non-Medical): No  Physical Activity: Insufficiently Active   Days of Exercise per Week: 5 days   Minutes of Exercise per Session: 20 min  Stress: No Stress Concern Present   Feeling of Stress : Not at all  Social Connections: Socially Integrated   Frequency of Communication with Friends and Family: Once a week   Frequency of Social Gatherings with Friends and Family: More than three times a week   Attends Religious Services: More than 4 times per year   Active Member of [employer] or Organizations: Yes   Attends Archivist Meetings: 1 to 4 times per year   Marital Status: Married    Tobacco Counseling Counseling given: Not Answered   Clinical Intake:  Pre-visit preparation completed: Yes  Pain : No/denies pain     BMI - recorded: 23.96 Nutritional Status: BMI <19  Underweight Nutritional Risks: None Diabetes: No  How often do you need to have someone help you when you read instructions, pamphlets, or other written materials from your doctor or pharmacy?: 1 -  Never  Diabetic?No  Interpreter Needed?: No  Information entered by :: Charlott Rakes, LPN   Activities of Daily Living In your present state of health, do you have any difficulty performing the following activities: 06/26/2021  Hearing? Y  Comment wears hearing aids  Vision? N  Difficulty concentrating or making decisions? N  Walking or climbing stairs? N  Dressing or bathing? N  Doing errands, shopping? N  Preparing Food and eating ? N  Using the Toilet? N  In the past six months, have you accidently leaked urine? Y  Comment [place]  Do you have problems with loss of bowel control? N  Managing your Medications? N  Managing your Finances? N  Housekeeping or managing your Housekeeping? N  Some recent data might be hidden  Patient Care Team: [name], [place] as PCP - General (Physician Assistant) Gastroenterology, Sadie Haber as Consulting Physician (Gastroenterology) Juluis Rainier as Consulting Physician (Optometry) Dentistry, [facility] as Consulting Physician (Dentistry)  Indicate any recent Medical Services you may have received from other than [facility] providers in the past year (date may be approximate).     Assessment:   This is a routine wellness examination for [place].  Hearing/Vision screen Hearing Screening - Comments:: Pt wears hearing aids  Vision Screening - Comments:: Pt follows up with Dr Alois Cliche for annual eye exams   Dietary issues and exercise activities discussed:     Goals Addressed             This Visit's Progress    Patient Stated       Lose about 5 lbs        Depression Screen PHQ 2/9 Scores 06/26/2021 06/20/2021 12/19/2020 06/20/2020 06/18/2020 06/16/2019 06/14/2018  PHQ - 2 Score 0 1 0 0 0 0 0  PHQ- 9 Score - 3 - - - 0 0    Fall Risk Fall Risk  12/19/2020 06/20/2020 06/18/2020 06/19/2019 06/16/2019  Falls in the past year? 0 0 0 0 0  Number falls in past yr: 0 0 0 - 0  Injury with Fall? 0 0 0 0 0  Risk for fall due to :  - Impaired balance/gait;Impaired vision No Fall Risks - -  Risk for fall due to: Comment - hx of vertigo - - -  Follow up - Falls prevention discussed Falls evaluation completed Falls evaluation completed;Education provided;Falls prevention discussed -    FALL RISK PREVENTION PERTAINING TO THE HOME:  Any stairs in or around the home? Yes  If so, are there any without handrails? No  Home free of loose throw rugs in walkways, pet beds, electrical cords, etc? Yes  Adequate lighting in your home to reduce risk of falls? Yes   ASSISTIVE DEVICES UTILIZED TO PREVENT FALLS:  Life alert? No  Use of a cane, walker or w/c? No  Grab bars in the bathroom? Yes  Shower chair or bench in shower? Yes  Elevated toilet seat or a handicapped toilet? No   TIMED UP AND GO:  Was the test performed? No .   Cognitive Function: MMSE - Mini Mental State Exam 06/07/2017  Orientation to time 5  Orientation to Place 5  Registration 3  Attention/ Calculation 5  Recall 3  Language- name 2 objects 2  Language- repeat 1  Language- follow 3 step command 3  Language- read & follow direction 1  Write a sentence 1  Copy design 1  Total score 30     6CIT Screen 06/20/2020 06/19/2019  What Year? 0 points 0 points  What month? 0 points 0 points  What time? - 0 points  Count back from 20 0 points 0 points  Months in reverse 0 points 0 points  Repeat phrase 2 points 2 points  Total Score - 2    Immunizations Immunization History  Administered Date(s) Administered   Fluad Quad(high Dose 65+) 03/21/2019, 03/31/2021   Influenza, High Dose Seasonal PF 06/07/2017, 04/14/2018, 03/14/2020   Influenza-Unspecified 04/21/2016, 03/31/2021   PFIZER(Purple Top)SARS-COV-2 Vaccination 08/17/2019, 09/11/2019, 04/09/2020, 11/12/2020, 04/01/2021   Pneumococcal Conjugate-13 05/18/2014   Pneumococcal Polysaccharide-23 01/29/2009   Td 01/17/2008   Tdap 06/14/2018   Zoster Recombinat (Shingrix) 08/04/2018, 01/03/2019    Zoster, Live 01/17/2008    TDAP status: Up to date  Flu Vaccine status: Up  to date  Pneumococcal vaccine status: Up to date  Covid-19 vaccine status: Completed vaccines  Qualifies for Shingles Vaccine? Yes   Zostavax completed Yes   Shingrix Completed?: Yes  Screening Tests Health Maintenance  Topic Date Due   COVID-19 Vaccine (6 - Booster for Pfizer series) 10/02/2021 (Originally 05/27/2021)   COLONOSCOPY (Pts 45-60yrs Insurance coverage will need to be confirmed)  07/20/2027   TETANUS/TDAP  06/14/2028   Pneumonia Vaccine 71+ Years old  Completed   INFLUENZA VACCINE  Completed   Hepatitis C Screening  Completed   Zoster Vaccines- Shingrix  Completed   HPV VACCINES  Aged Out    Health Maintenance  There are no preventive care reminders to display for this patient.  Colorectal cancer screening: Type of screening: Colonoscopy. Completed 07/19/20. Repeat every 7 years   Additional Screening:  Hepatitis C Screening: Completed 06/10/17  Vision Screening: Recommended annual ophthalmology exams for early detection of glaucoma and other disorders of the eye. Is the patient up to date with their annual eye exam?  Yes  Who is the provider or what is the name of the office in which the patient attends annual eye exams? Dr Alois Cliche If pt is not established with a provider, would they like to be referred to a provider to establish care? No .   Dental Screening: Recommended annual dental exams for proper oral hygiene  Community Resource Referral / Chronic Care Management: CRR required this visit?  No   CCM required this visit?  No      Plan:     I have personally reviewed and noted the following in the patients chart:   Medical and social history Use of alcohol, tobacco or illicit drugs  Current medications and supplements including opioid prescriptions. Patient is not currently taking opioid prescriptions. Functional ability and status Nutritional status Physical  activity Advanced directives List of other physicians Hospitalizations, surgeries, and ER visits in previous 12 months Vitals Screenings to include cognitive, depression, and falls Referrals and appointments  In addition, I have reviewed and discussed with patient certain preventive protocols, quality metrics, and best practice recommendations. A written personalized care plan for preventive services as well as general preventive health recommendations were provided to patient.     Willette Brace, LPN   [id number]   Nurse Notes: None

## 2021-06-26 NOTE — Patient Instructions (Signed)
[name] , Thank you for taking time to come for your Medicare Wellness Visit. I appreciate your ongoing commitment to your health goals. Please review the following plan we discussed and let me know if I can assist you in the future.   Screening recommendations/referrals: Colonoscopy: Done 07/19/20 repeat every 7 years  Recommended yearly ophthalmology/optometry visit for glaucoma screening and checkup Recommended yearly dental visit for hygiene and checkup  Vaccinations: Influenza vaccine: Done 03/31/21 Pneumococcal vaccine: Up to date Tdap vaccine: Done 06/14/18 repeat every 10 years  Shingles vaccine: Completed  1/23, 01/03/19 Covid-19: Completed 2/4, 3/1, 04/09/20 & 11/12/20  Advanced directives: Copies in chart   Conditions/risks identified: Lose about 5 lbs   Next appointment: Follow up in one year for your annual wellness visit.   Preventive Care 77 Years and Older, Male Preventive care refers to lifestyle choices and visits with your health care provider that can promote health and wellness. What does preventive care include? A yearly physical exam. This is also called an annual well check. Dental exams once or twice a year. Routine eye exams. Ask your health care provider how often you should have your eyes checked. Personal lifestyle choices, including: Daily care of your teeth and gums. Regular physical activity. Eating a healthy diet. Avoiding tobacco and drug use. Limiting alcohol use. Practicing safe sex. Taking low doses of aspirin every day. Taking vitamin and mineral supplements as recommended by your health care provider. What happens during an annual well check? The services and screenings done by your health care provider during your annual well check will depend on your age, overall health, lifestyle risk factors, and family history of disease. Counseling  Your health care provider may ask you questions about your: Alcohol use. Tobacco use. Drug  use. Emotional well-being. Home and relationship well-being. Sexual activity. Eating habits. History of falls. Memory and ability to understand (cognition). Work and work Statistician. Screening  You may have the following tests or measurements: Height, weight, and BMI. Blood pressure. Lipid and cholesterol levels. These may be checked every 5 years, or more frequently if you are over 77 years old. Skin check. Lung cancer screening. You may have this screening every year starting at age 77 if you have a 30-pack-year history of smoking and currently smoke or have quit within the past 15 years. Fecal occult blood test (FOBT) of the stool. You may have this test every year starting at age 77. Flexible sigmoidoscopy or colonoscopy. You may have a sigmoidoscopy every 5 years or a colonoscopy every 10 years starting at age 77. Prostate cancer screening. Recommendations will vary depending on your family history and other risks. Hepatitis C blood test. Hepatitis B blood test. Sexually transmitted disease (STD) testing. Diabetes screening. This is done by checking your blood sugar (glucose) after you have not eaten for a while (fasting). You may have this done every 1-3 years. Abdominal aortic aneurysm (AAA) screening. You may need this if you are a current or former smoker. Osteoporosis. You may be screened starting at age 77 if you are at high risk. Talk with your health care provider about your test results, treatment options, and if necessary, the need for more tests. Vaccines  Your health care provider may recommend certain vaccines, such as: Influenza vaccine. This is recommended every year. Tetanus, diphtheria, and acellular pertussis (Tdap, Td) vaccine. You may need a Td booster every 10 years. Zoster vaccine. You may need this after age 77. Pneumococcal 13-valent conjugate (PCV13) vaccine. One dose  is recommended after age 77. Pneumococcal polysaccharide (PPSV23) vaccine. One dose is  recommended after age 77. Talk to your health care provider about which screenings and vaccines you need and how often you need them. This information is not intended to replace advice given to you by your health care provider. Make sure you discuss any questions you have with your health care provider. Document Released: 07/26/2015 Document Revised: 03/18/2016 Document Reviewed: 04/30/2015 Elsevier Interactive Patient Education  2017 [place] Prevention in the Home Falls can cause injuries. They can happen to people of all ages. There are many things you can do to make your home safe and to help prevent falls. What can I do on the outside of my home? Regularly fix the edges of walkways and driveways and fix any cracks. Remove anything that might make you trip as you walk through a door, such as a raised step or threshold. Trim any bushes or trees on the path to your home. Use bright outdoor lighting. Clear any walking paths of anything that might make someone trip, such as rocks or tools. Regularly check to see if handrails are loose or broken. Make sure that both sides of any steps have handrails. Any raised decks and porches should have guardrails on the edges. Have any leaves, snow, or ice cleared regularly. Use sand or salt on walking paths during winter. Clean up any spills in your garage right away. This includes oil or grease spills. What can I do in the bathroom? Use night lights. Install grab bars by the toilet and in the tub and shower. Do not use towel bars as grab bars. Use non-skid mats or decals in the tub or shower. If you need to sit down in the shower, use a plastic, non-slip stool. Keep the floor dry. Clean up any water that spills on the floor as soon as it happens. Remove soap buildup in the tub or shower regularly. Attach bath mats securely with double-sided non-slip rug tape. Do not have throw rugs and other things on the floor that can make you  trip. What can I do in the bedroom? Use night lights. Make sure that you have a light by your bed that is easy to reach. Do not use any sheets or blankets that are too big for your bed. They should not hang down onto the floor. Have a firm chair that has side arms. You can use this for support while you get dressed. Do not have throw rugs and other things on the floor that can make you trip. What can I do in the kitchen? Clean up any spills right away. Avoid walking on wet floors. Keep items that you use a lot in easy-to-reach places. If you need to reach something above you, use a strong step stool that has a grab bar. Keep electrical cords out of the way. Do not use floor polish or wax that makes floors slippery. If you must use wax, use non-skid floor wax. Do not have throw rugs and other things on the floor that can make you trip. What can I do with my stairs? Do not leave any items on the stairs. Make sure that there are handrails on both sides of the stairs and use them. Fix handrails that are broken or loose. Make sure that handrails are as long as the stairways. Check any carpeting to make sure that it is firmly attached to the stairs. Fix any carpet that is loose or worn. Avoid  having throw rugs at the top or bottom of the stairs. If you do have throw rugs, attach them to the floor with carpet tape. Make sure that you have a light switch at the top of the stairs and the bottom of the stairs. If you do not have them, ask someone to add them for you. What else can I do to help prevent falls? Wear shoes that: Do not have high heels. Have rubber bottoms. Are comfortable and fit you well. Are closed at the toe. Do not wear sandals. If you use a stepladder: Make sure that it is fully opened. Do not climb a closed stepladder. Make sure that both sides of the stepladder are locked into place. Ask someone to hold it for you, if possible. Clearly mark and make sure that you can  see: Any grab bars or handrails. First and last steps. Where the edge of each step is. Use tools that help you move around (mobility aids) if they are needed. These include: Canes. Walkers. Scooters. Crutches. Turn on the lights when you go into a dark area. Replace any light bulbs as soon as they burn out. Set up your furniture so you have a clear path. Avoid moving your furniture around. If any of your floors are uneven, fix them. If there are any pets around you, be aware of where they are. Review your medicines with your doctor. Some medicines can make you feel dizzy. This can increase your chance of falling. Ask your doctor what other things that you can do to help prevent falls. This information is not intended to replace advice given to you by your health care provider. Make sure you discuss any questions you have with your health care provider. Document Released: 04/25/2009 Document Revised: 12/05/2015 Document Reviewed: 08/03/2014 Elsevier Interactive Patient Education  2017 [employer].

## 2021-08-13 ENCOUNTER — Other Ambulatory Visit: Payer: Self-pay | Admitting: Physician Assistant

## 2021-09-22 ENCOUNTER — Encounter: Payer: Self-pay | Admitting: Physician Assistant

## 2021-09-22 ENCOUNTER — Ambulatory Visit (INDEPENDENT_AMBULATORY_CARE_PROVIDER_SITE_OTHER): Payer: Medicare Other | Admitting: Physician Assistant

## 2021-09-22 VITALS — BP 171/80 | HR 88 | Temp 98.5°F | Ht 69.0 in | Wt 165.4 lb

## 2021-09-22 DIAGNOSIS — R21 Rash and other nonspecific skin eruption: Secondary | ICD-10-CM

## 2021-09-22 DIAGNOSIS — R209 Unspecified disturbances of skin sensation: Secondary | ICD-10-CM | POA: Diagnosis not present

## 2021-09-22 DIAGNOSIS — R0982 Postnasal drip: Secondary | ICD-10-CM

## 2021-09-22 DIAGNOSIS — E88819 Insulin resistance, unspecified: Secondary | ICD-10-CM

## 2021-09-22 DIAGNOSIS — I10 Essential (primary) hypertension: Secondary | ICD-10-CM

## 2021-09-22 DIAGNOSIS — E8881 Metabolic syndrome: Secondary | ICD-10-CM | POA: Diagnosis not present

## 2021-09-22 LAB — POCT GLYCOSYLATED HEMOGLOBIN (HGB A1C): Hemoglobin A1C: 5.8 % — AB (ref 4.0–5.6)

## 2021-09-22 NOTE — Progress Notes (Signed)
Kenneth Salazar is a 76 y.o. male here for a follow up of a pre-existing problem. ? ?History of Present Illness:  ? ?Chief Complaint  ?Patient presents with  ? Follow-up  ?  Pt states it feels ike he has a hat on his head all the time but nothing there; wanting to look at spots on right hand to see if we know what it could be.   ? ? ?HPI ? ?HTN ?Currently compliant with taking lisinopril 10 mg daily with no adverse effects. At home blood pressure readings are: systolic ranging 469-629 and diastolic ranging 52-84. States he has noticed his BP is only high when he is in office and is unsure why since he is relatively relaxed. Patient denies chest pain, SOB, blurred vision, dizziness, unusual headaches, lower leg swelling. Denies excessive caffeine intake, stimulant usage, excessive alcohol intake, or increase in salt consumption. ? ?At Home BP Readings:  ?03/11- 128/74 ?03/12- 130/74 ?03/13- 130/80 ? ?BP Readings from Last 3 Encounters:  ?09/22/21 (!) 171/80  ?06/20/21 (!) 172/87  ?01/31/21 (!) 160/98  ? ? ?Insulin Resistance ?Blood sugars at home are: averaging 130s when fasting and 140s right after eating, but always quickly resolves. Upon checking his levels this morning, it was 124. Patient is not on any medication for this issue, instead choosing to improve his eating habits and participate in regular exercise. Denies: hypoglycemic or hyperglycemic episodes or symptoms.  ? ?Lab Results  ?Component Value Date  ? HGBA1C 5.8 (A) 09/22/2021  ? ? ?Skin Irritation ?Kenneth Salazar expresses that he noticed his skin has remained irritated, specifically on both hands. He does admit that he had a white peely-like dry patch on his left hand that was slightly itchy, but has since improved. Although he has been using Eucerin topical cream daily, he is interested in what more he could do.  ? ? ?Skin Sensation Disturbance ?In addition to his skin irritation, pt has also been experiencing what he describes as feeling like a crown/hat is on  his head despite nothing being there. States this has been occurring intermittently for the past week. He does admit that he wears hats often, including a tight cap that he wears during the winter. At this time he isn't interested in any further intervention, but wanted to bring it up just in case something more significant could be going on. Denies vision changes, lesions, skin changes, or pain.  ? ? ?PND ?Pt is compliant with using atrovent nasal spray daily with no complications. He has found this beneficial and is tolerating well. Sometimes gets coughing episodes that causes his wife to be concerned, but he is not overly concerned about this. ? ?Past Medical History:  ?Diagnosis Date  ? Allergy 2005  ? Arthritis   ? Cancer [facility])   ? prostate 01-2015  ? Hyperlipidemia   ? Hypertension   ? no meds  ? ?  ?Social History  ? ?Tobacco Use  ? Smoking status: Never  ? Smokeless tobacco: Never  ?Vaping Use  ? Vaping Use: Never used  ?Substance Use Topics  ? Alcohol use: Yes  ?  Comment: Rarely  ? Drug use: No  ? ? ?Past Surgical History:  ?Procedure Laterality Date  ? COLONOSCOPY  2013, 07/2015  ? Dr.Schooler  ? fatty tissue removed chin    ? HERNIA REPAIR    ? MINOR EXCISION OF ORAL LESION    ? PROSTATE SURGERY    ? reomoved 01-2015  ? VASECTOMY  1977  ? ? ?  Family History  ?Problem Relation Age of Onset  ? Diabetes Mother   ? Hearing loss Mother   ? Heart disease Mother   ? Hypertension Mother   ? Obesity Mother   ? Stroke Father   ? Diabetes Maternal Uncle   ? Colon cancer Neg Hx   ? Colon polyps Neg Hx   ? Esophageal cancer Neg Hx   ? Rectal cancer Neg Hx   ? Stomach cancer Neg Hx   ? ? ?Allergies  ?Allergen Reactions  ? Sulfa Antibiotics Hives  ? Antihistamines, Chlorpheniramine-Type Palpitations  ? Antihistamines, Diphenhydramine-Type Palpitations  ? Antihistamines, Loratadine-Type Palpitations  ? Percocet [Oxycodone-Acetaminophen] Palpitations  ? ? ?Current Medications:  ? ?Current Outpatient Medications:  ?  aspirin  EC 81 MG tablet, Take 81 mg by mouth daily. , Disp: , Rfl:  ?  ipratropium (ATROVENT) 0.06 % nasal spray, Place 2 sprays into both nostrils 4 (four) times daily., Disp: 15 mL, Rfl: 12 ?  lisinopril (ZESTRIL) 10 MG tablet, Take 1 tablet (10 mg total) by mouth daily., Disp: 90 tablet, Rfl: 1 ?  Multiple Vitamin (MULTIVITAMIN) tablet, Take 1 tablet by mouth daily. , Disp: , Rfl:  ?  Multiple Vitamins-Minerals (PRESERVISION AREDS PO), Take by mouth., Disp: , Rfl:  ?  pravastatin (PRAVACHOL) 20 MG tablet, TAKE 1 TABLET BY MOUTH  DAILY, Disp: 90 tablet, Rfl: 3 ?  Psyllium (METAMUCIL MULTIHEALTH FIBER PO), , Disp: , Rfl:   ? ?Review of Systems:  ? ?ROS ?Negative unless otherwise specified per HPI. ?Vitals:  ? ?Vitals:  ? 09/22/21 1057  ?BP: (!) 171/80  ?Pulse: 88  ?Temp: 98.5 ?F (36.9 ?C)  ?TempSrc: Temporal  ?SpO2: 92%  ?Weight: 165 lb 6.4 oz (75 kg)  ?Height: '5\' 9"'$  (1.753 m)  ?   ?Body mass index is 24.43 kg/m?. ? ?Physical Exam:  ? ?Physical Exam ?Vitals and nursing note reviewed.  ?Constitutional:   ?   General: He is not in acute distress. ?   Appearance: He is well-developed. He is not ill-appearing or toxic-appearing.  ?Cardiovascular:  ?   Rate and Rhythm: Normal rate and regular rhythm.  ?   Pulses: Normal pulses.  ?   Heart sounds: Normal heart sounds, S1 normal and S2 normal.  ?Pulmonary:  ?   Effort: Pulmonary effort is normal.  ?   Breath sounds: Normal breath sounds.  ?Skin: ?   General: Skin is warm and dry.  ?   Comments: Scattered areas of erythematous plaques on dorsal surface of R hand ? ?No obvious skin abnormalities or TTP to scalp  ?Neurological:  ?   Mental Status: He is alert.  ?   GCS: GCS eye subscore is 4. GCS verbal subscore is 5. GCS motor subscore is 6.  ?Psychiatric:     ?   Speech: Speech normal.     ?   Behavior: Behavior normal. Behavior is cooperative.  ? ?Results for orders placed or performed in visit on 09/22/21  ?POCT HgB A1C  ?Result Value Ref Range  ? Hemoglobin A1C 5.8 (A) 4.0 -  5.6 %  ? HbA1c POC (<> result, manual entry)    ? HbA1c, POC (prediabetic range)    ? HbA1c, POC (controlled diabetic range)    ? ? ? ?Assessment and Plan:  ? ?Insulin resistance ?HgbA1c improved from 6.3% to 5.8% ?Encouraged patient to continue to participate in healthy eating and regular exercise  ?Follow up 6 months for CPE ? ?Hypertension, unspecified type ?Normotensive  at home; historically above goal at the office ?Maintain lisinopril 10 mg daily ?Continue to monitor BP regularly at home ?Encouraged patient to continue participating in healthy eating and daily exercise ?I advised patient that if BP readings are consistently >150/100, to reach out to office and medication will be adjusted ?Follow up 6 months sooner if concerns ? ?Rash ?No red flags -- possible dermatitis ?Trial OTC "anti-itch"cream with hydrocortisone for additional relief  ?Follow up if new/worsening symptoms or concerns occur ? ?Skin sensation disturbance ?No red flags  ?Patient declines further intervention at this time ?Follow up if new/worsening symptoms or concerns occur ? ?PND ?Continue astelin nasal spray ?Encouraged close follow-up if symptoms worsen or develops more "choking/coughing spells" -- denies any work-up at this time ? ?I,Havlyn C Ratchford,acting as a scribe for [employer], PA.,have documented all relevant documentation on the behalf of [name], PA,as directed by  [name], PA while in the presence of [name], [place]. ? ?IInda Coke, PA, have reviewed all documentation for this visit. The documentation on 09/22/21 for the exam, diagnosis, procedures, and orders are all accurate and complete. ? ? ?[name], PA-C ? ?

## 2021-09-22 NOTE — Patient Instructions (Signed)
It was great to see you! ? ?Consider over the counter "anti-itch" cream with hydrocortisone to help with your hand rash -- if no better, let me know ? ?I'm pleased with your home blood pressure readings and your blood sugar results today in the office ? ?If your scalp symptom worsens -- let me know ?If your coughing/choking on saliva worsens -- let me know ? ?Return for physical after 06/20/22 -- sooner if concerns! ? ?Take care, ? ?[name] PA-C  ?

## 2021-11-02 ENCOUNTER — Other Ambulatory Visit: Payer: Self-pay | Admitting: Physician Assistant

## 2021-11-03 ENCOUNTER — Encounter: Payer: Self-pay | Admitting: Physician Assistant

## 2022-02-04 ENCOUNTER — Encounter: Payer: Self-pay | Admitting: Physician Assistant

## 2022-02-04 DIAGNOSIS — K4090 Unilateral inguinal hernia, without obstruction or gangrene, not specified as recurrent: Secondary | ICD-10-CM

## 2022-02-04 NOTE — Telephone Encounter (Signed)
Do you need to see pt or can we send referral?

## 2022-02-09 ENCOUNTER — Emergency Department (HOSPITAL_BASED_OUTPATIENT_CLINIC_OR_DEPARTMENT_OTHER): Payer: Medicare Other | Admitting: Radiology

## 2022-02-09 ENCOUNTER — Encounter (HOSPITAL_BASED_OUTPATIENT_CLINIC_OR_DEPARTMENT_OTHER): Payer: Self-pay | Admitting: Emergency Medicine

## 2022-02-09 ENCOUNTER — Other Ambulatory Visit: Payer: Self-pay

## 2022-02-09 ENCOUNTER — Emergency Department (HOSPITAL_BASED_OUTPATIENT_CLINIC_OR_DEPARTMENT_OTHER)
Admission: EM | Admit: 2022-02-09 | Discharge: 2022-02-09 | Disposition: A | Discharge status: Home / Self Care | Payer: Medicare Other | Attending: Emergency Medicine | Admitting: Emergency Medicine

## 2022-02-09 DIAGNOSIS — I10 Essential (primary) hypertension: Secondary | ICD-10-CM | POA: Insufficient documentation

## 2022-02-09 DIAGNOSIS — Z79899 Other long term (current) drug therapy: Secondary | ICD-10-CM | POA: Diagnosis not present

## 2022-02-09 DIAGNOSIS — Z8546 Personal history of malignant neoplasm of prostate: Secondary | ICD-10-CM | POA: Diagnosis not present

## 2022-02-09 DIAGNOSIS — Z7982 Long term (current) use of aspirin: Secondary | ICD-10-CM | POA: Insufficient documentation

## 2022-02-09 LAB — BASIC METABOLIC PANEL
Anion gap: 12 (ref 5–15)
BUN: 15 mg/dL (ref 8–23)
CO2: 25 mmol/L (ref 22–32)
Calcium: 9.4 mg/dL (ref 8.9–10.3)
Chloride: 100 mmol/L (ref 98–111)
Creatinine, Ser: 1.12 mg/dL (ref 0.61–1.24)
GFR, Estimated: >60 mL/min (ref >60)
Glucose, Bld: 191 mg/dL — ABNORMAL HIGH (ref 70–99)
Potassium: 3.6 mmol/L (ref 3.5–5.1)
Sodium: 137 mmol/L (ref 135–145)

## 2022-02-09 LAB — CBC
HCT: 43.4 % (ref 39.0–52.0)
Hemoglobin: 15.1 g/dL (ref 13.0–17.0)
MCH: 31.2 pg (ref 26.0–34.0)
MCHC: 34.8 g/dL (ref 30.0–36.0)
MCV: 89.7 fL (ref 80.0–100.0)
Platelets: 147 10*3/uL — ABNORMAL LOW (ref 150–400)
RBC: 4.84 MIL/uL (ref 4.22–5.81)
RDW: 13.2 % (ref 11.5–15.5)
WBC: 6 10*3/uL (ref 4.0–10.5)
nRBC: 0 % (ref 0.0–0.2)

## 2022-02-09 LAB — HEPATIC FUNCTION PANEL
ALT: 26 U/L (ref 0–44)
AST: 20 U/L (ref 15–41)
Albumin: 4.6 g/dL (ref 3.5–5.0)
Alkaline Phosphatase: 61 U/L (ref 38–126)
Bilirubin, Direct: 0.1 mg/dL (ref 0.0–0.2)
Indirect Bilirubin: 0.6 mg/dL (ref 0.3–0.9)
Total Bilirubin: 0.7 mg/dL (ref 0.3–1.2)
Total Protein: 6.9 g/dL (ref 6.5–8.1)

## 2022-02-09 LAB — URINALYSIS, ROUTINE W REFLEX MICROSCOPIC
Bilirubin Urine: NEGATIVE
Glucose, UA: NEGATIVE mg/dL
Hgb urine dipstick: NEGATIVE
Ketones, ur: NEGATIVE mg/dL
Leukocytes,Ua: NEGATIVE
Nitrite: NEGATIVE
Protein, ur: NEGATIVE mg/dL
Specific Gravity, Urine: <1.005 — ABNORMAL LOW (ref 1.005–1.030)
pH: 7 (ref 5.0–8.0)

## 2022-02-09 LAB — TROPONIN I (HIGH SENSITIVITY)
Troponin I (High Sensitivity): 4 ng/L (ref <18)
Troponin I (High Sensitivity): 5 ng/L (ref <18)

## 2022-02-09 LAB — TSH: TSH: 3.331 u[IU]/mL (ref 0.350–4.500)

## 2022-02-09 LAB — T4, FREE: Free T4: 0.97 ng/dL (ref 0.61–1.12)

## 2022-02-09 LAB — LIPASE, BLOOD: Lipase: 14 U/L (ref 11–51)

## 2022-02-09 MED ORDER — LABETALOL HCL 5 MG/ML IV SOLN
10.0000 mg | Freq: Once | INTRAVENOUS | Status: AC
  Administered 2022-02-09: 10 mg via INTRAVENOUS
  Filled 2022-02-09: qty 4

## 2022-02-09 NOTE — ED Triage Notes (Signed)
Pt here from home with c/o htn and tachycardia , no nausea or sob

## 2022-02-09 NOTE — ED Provider Notes (Signed)
[facility] EMERGENCY DEPT Provider Note   CSN: [id number] Arrival date & time: 02/09/22  1424     History {Add pertinent medical, surgical, social history, OB history to HPI:1} Chief Complaint  Patient presents with   Hypertension    Kenneth Salazar is a 76 y.o. male.  HPI       76 year old male with a history of hypertension, hyperlipidemia, prostate cancer who presents with concern for elevated blood pressure.     Taking lisinopril 10 mg  Last night felt warm just sitting at the computer Checked temperature and was 98.8 usually runs 96 systolic BP this morning was 140, then 160s/80s then this afternoon was 200s Feeling of indigestion that comes and goes over the last few days, has had it today, in waiting room, burps then gets better. Not worse with walking or exerting self> Exercise, walk every morning ,did that this AM without problems.  No shortness of breath No nausea or vomiting, diarrhea, cough, sore throat, runny nose, leg swelling Denies numbness, weakness, difficulty talking or walking, visual changes or facial droop. Right now slight lightheadedness, not headache A little upper chest discomfort or soreness   No smoking, etoh once in a great while, no other drugs  Has not stopped or started any medications recently Has had some groin bulging and occasional pain on left, not really today.  Past Medical History:  Diagnosis Date   Allergy 2005   Arthritis    Cancer [facility])    prostate 01-2015   Hyperlipidemia    Hypertension    no meds    Home Medications Prior to Admission medications   Medication Sig Start Date End Date Taking? Authorizing Provider  aspirin EC 81 MG tablet Take 81 mg by mouth daily.     [provider]  ipratropium (ATROVENT) 0.06 % nasal spray Place 2 sprays into both nostrils 4 (four) times daily. 06/20/21   [name], PA  lisinopril (ZESTRIL) 10 MG tablet TAKE 1 TABLET BY MOUTH DAILY 11/03/21   [name], PA  Multiple Vitamin (MULTIVITAMIN) tablet Take 1 tablet by mouth daily.     [provider]  Multiple Vitamins-Minerals (PRESERVISION AREDS PO) Take by mouth.    [provider]  pravastatin (PRAVACHOL) 20 MG tablet TAKE 1 TABLET BY MOUTH  DAILY 08/13/21   [name], PA  Psyllium (METAMUCIL MULTIHEALTH FIBER PO)  11/10/20   [provider]      Allergies    Sulfa antibiotics; Antihistamines, chlorpheniramine-type; Antihistamines, diphenhydramine-type; Antihistamines, loratadine-type; and Percocet [oxycodone-acetaminophen]    Review of Systems   Review of Systems  Physical Exam Updated Vital Signs BP (!) 215/91   Pulse 100   Temp 98.6 F (37 C)   Resp 16   Ht '5\' 9"'$  (1.753 m)   Wt 72.1 kg   SpO2 100%   BMI 23.48 kg/m  Physical Exam Vitals and nursing note reviewed.  Constitutional:      General: He is not in acute distress.    Appearance: He is well-developed. He is not diaphoretic.  HENT:     Head: Normocephalic and atraumatic.  Eyes:     Conjunctiva/sclera: Conjunctivae normal.  Cardiovascular:     Rate and Rhythm: Normal rate and regular rhythm.     Heart sounds: Normal heart sounds. No murmur heard.    No friction rub. No gallop.  Pulmonary:     Effort: Pulmonary effort is normal. No respiratory distress.     Breath sounds: Normal breath sounds.  No wheezing or rales.  Abdominal:     General: There is no distension.     Palpations: Abdomen is soft.     Tenderness: There is no abdominal tenderness. There is no guarding.  Musculoskeletal:     Cervical back: Normal range of motion.  Skin:    General: Skin is warm and dry.  Neurological:     Mental Status: He is alert and oriented to person, place, and time.     ED Results / Procedures / Treatments   Labs (all labs ordered are listed, but only abnormal results are displayed) Labs Reviewed  BASIC METABOLIC PANEL - Abnormal; Notable for the following components:       Result Value   Glucose, Bld 191 (*)    All other components within normal limits  CBC - Abnormal; Notable for the following components:   Platelets 147 (*)    All other components within normal limits  TROPONIN I (HIGH SENSITIVITY)  TROPONIN I (HIGH SENSITIVITY)    EKG EKG Interpretation  Date/Time:  Monday February 09 2022 14:35:58 EDT Ventricular Rate:  106 PR Interval:  166 QRS Duration: 86 QT Interval:  338 QTC Calculation: 448 R Axis:   61 Text Interpretation: Sinus tachycardia Nonspecific ST abnormality Abnormal ECG Since prior ECG, rate has increased, similar nonspecific TW abnormalities Confirmed by Gareth Morgan [phone]) on 02/09/2022 4:27:18 PM  Radiology DG Chest 2 View  Result Date: 02/09/2022 CLINICAL DATA:  cp/sob EXAM: CHEST - 2 VIEW COMPARISON:  None Available. FINDINGS: The heart size and mediastinal contours are within normal limits. Both lungs are clear. No visible pleural effusions or pneumothorax. No acute osseous abnormality. Degenerative changes of the spine including bridging osteophytes. IMPRESSION: No active cardiopulmonary disease. Electronically Signed   By: Margaretha Sheffield M.D.   On: 02/09/2022 15:11    Procedures Procedures  {Document cardiac monitor, telemetry assessment procedure when appropriate:1}  Medications Ordered in ED Medications - No data to display  ED Course/ Medical Decision Making/ A&P                           Medical Decision Making Amount and/or Complexity of Data Reviewed Labs: ordered. Radiology: ordered.  Risk Prescription drug management.   76 year old male with a history of hypertension, hyperlipidemia, prostate cancer who presents with concern for elevated blood pressure.  Arrives with elevated HR and hypertension.  Denies possible medication or alcohol withdrawal.  Reports feeling warm, TMAX 98.8 which is high for patient but not having true fever or infectious symptoms.  Not having focal neurologic symptoms to  suggest CVA> Has some indigestion. Troponin checked without elevation. Hx and exam do not seem consistent with dissection ***.  Labs with normal electrolytes, no leukocytosis, no anemia.  CXR without pneumonia or signs of CHF or widened mediastinum.   Given labetalol for symptoms. TSH/Free T4 ordered.   {Document critical care time when appropriate:1} {Document review of labs and clinical decision tools ie heart score, Chads2Vasc2 etc:1}  {Document your independent review of radiology images, and any outside records:1} {Document your discussion with family members, caretakers, and with consultants:1} {Document social determinants of health affecting pt's care:1} {Document your decision making why or why not admission, treatments were needed:1} Final Clinical Impression(s) / ED Diagnoses Final diagnoses:  None    Rx / DC Orders ED Discharge Orders     None

## 2022-02-09 NOTE — Discharge Instructions (Addendum)
Increase your lisinopril dose to '20mg'$  (2 of your '10mg'$  tablets daily from '10mg'$  daily). Follow up closely with your doctor.

## 2022-02-09 NOTE — ED Notes (Signed)
ED Provider at bedside. 

## 2022-02-11 ENCOUNTER — Ambulatory Visit (INDEPENDENT_AMBULATORY_CARE_PROVIDER_SITE_OTHER): Payer: Medicare Other | Admitting: Physician Assistant

## 2022-02-11 ENCOUNTER — Ambulatory Visit (INDEPENDENT_AMBULATORY_CARE_PROVIDER_SITE_OTHER): Payer: Medicare Other

## 2022-02-11 ENCOUNTER — Encounter: Payer: Self-pay | Admitting: Physician Assistant

## 2022-02-11 VITALS — BP 164/80 | HR 79 | Temp 98.2°F | Ht 69.0 in | Wt 158.5 lb

## 2022-02-11 DIAGNOSIS — K30 Functional dyspepsia: Secondary | ICD-10-CM

## 2022-02-11 DIAGNOSIS — I10 Essential (primary) hypertension: Secondary | ICD-10-CM | POA: Diagnosis not present

## 2022-02-11 DIAGNOSIS — E785 Hyperlipidemia, unspecified: Secondary | ICD-10-CM

## 2022-02-11 DIAGNOSIS — H6123 Impacted cerumen, bilateral: Secondary | ICD-10-CM | POA: Diagnosis not present

## 2022-02-11 NOTE — Patient Instructions (Signed)
It was great to see you!  Continue lisinopril 20 mg daily  We are going to order a heart monitor x 7 days and a calcium score of your heart to evaluate your cardiac arteries. You will be contacted about these appointments.  Let's follow-up in 3 months for BP follow-up, sooner if you have concerns.  If a referral was placed today --> you will be contacted for an appointment. Please note that routine referrals can sometimes take up to 3-4 weeks to process. Please call our office if you haven't heard anything after this time frame.  If blood work, urine studies, or any imaging was ordered today -->  we will release your results to you on your MyChart account (if you have chosen to sign up for this) with further instructions. You may see the results before I do, but when I review them I will send you a message with my report or have my staff call you if things need to be discussed. Please reply to my message with any questions.   Take care,  [name] PA-C

## 2022-02-11 NOTE — Progress Notes (Signed)
Kenneth Salazar is a 76 y.o. male here for a follow up on HTN.   History of Present Illness:   Chief Complaint  Patient presents with   Hypertension    Pt has been checking blood pressure at home. Bp this morning 160/94, yesterday at 5:00 PM was 124/69. Headaches off and on.    HPI  Hypertension  Patient presented to the ED with elevated blood pressure on 02/09/2022. He states he was feeling warm at that time and checked his temperature which was found to be 98.8.  Per pt, he checked his BP that morning before going to ED and this was ranging 140-160 and then elevated to 200s. Some heart palpitations during these episodes.  He had work-up in the ED including troponin with elevation. Normal electrolytes and no leukocytosis. Chest x-ray without any sign of pneumonia. TSH was negative as well. UA without any infection. He was given labetalol at that time. He was taking Lisinopril 10 mg daily with no issue. In the ED, his Lisinopril was increased to 20 mg and was recommended to follow up with PCP.  Today, he states he has been checking his blood pressure at home. His BP this morning was 160/94 and 124/69 yesterday.Patient is willing to trial heart monitor for this issue. He has been doing well and denies any other concerns. Denies headaches or vision changes.   BP Log since the ED visit 139/75 124/69 131/72 124/69  125/70   Indigestion  Patient complain of indigestion issue that has been onset for a while. States he takes Tums occasionally. Has not been eating any different. Follows healthy and clean diet. Denies any worsening sx. Denies nausea or vomiting. No increased caffeine intake.    HLD Currently taking pravastatin 20 mg daily and tolerating well.   Ear Fullness  Patient states that his left ear feel clogged. States he is unable to ear for few seconds if he sleeps on left side. Tries to clean his ear but this does not help. No other treatment tried. Denies drainage or pain.    Past Medical History:  Diagnosis Date   Allergy 2005   Arthritis    Cancer [facility])    prostate 01-2015   Hyperlipidemia    Hypertension    no meds     Social History   Tobacco Use   Smoking status: Never   Smokeless tobacco: Never  Vaping Use   Vaping Use: Never used  Substance Use Topics   Alcohol use: Yes    Comment: Rarely   Drug use: No    Past Surgical History:  Procedure Laterality Date   COLONOSCOPY  2013, 07/2015   Dr.Schooler   fatty tissue removed chin     HERNIA REPAIR     MINOR EXCISION OF ORAL LESION     PROSTATE SURGERY     reomoved 01-2015   VASECTOMY  1977    Family History  Problem Relation Age of Onset   Diabetes Mother    Hearing loss Mother    Heart disease Mother    Hypertension Mother    Obesity Mother    Stroke Father    Diabetes Maternal Uncle    Colon cancer Neg Hx    Colon polyps Neg Hx    Esophageal cancer Neg Hx    Rectal cancer Neg Hx    Stomach cancer Neg Hx     Allergies  Allergen Reactions   Sulfa Antibiotics Hives   Antihistamines, Chlorpheniramine-Type Palpitations   Antihistamines, Diphenhydramine-Type  Palpitations   Antihistamines, Loratadine-Type Palpitations   Percocet [Oxycodone-Acetaminophen] Palpitations    Current Medications:   Current Outpatient Medications:    aspirin EC 81 MG tablet, Take 81 mg by mouth daily. , Disp: , Rfl:    ipratropium (ATROVENT) 0.06 % nasal spray, Place 2 sprays into both nostrils 4 (four) times daily., Disp: 15 mL, Rfl: 12   lisinopril (ZESTRIL) 10 MG tablet, TAKE 1 TABLET BY MOUTH DAILY (Patient taking differently: Take 20 mg by mouth daily.), Disp: 90 tablet, Rfl: 3   Multiple Vitamin (MULTIVITAMIN) tablet, Take 1 tablet by mouth daily. , Disp: , Rfl:    Multiple Vitamins-Minerals (PRESERVISION AREDS PO), Take by mouth., Disp: , Rfl:    pravastatin (PRAVACHOL) 20 MG tablet, TAKE 1 TABLET BY MOUTH  DAILY, Disp: 90 tablet, Rfl: 3   Psyllium (METAMUCIL MULTIHEALTH FIBER PO), ,  Disp: , Rfl:    Review of Systems:   ROS Negative unless otherwise specified per HPI.   Vitals:   Vitals:   02/11/22 0924 02/11/22 1009  BP: (!) 148/76 (!) 164/80  Pulse: 95 79  Temp: 98.2 F (36.8 C)   TempSrc: Temporal   SpO2: 98%   Weight: 158 lb 8 oz (71.9 kg)   Height: '5\' 9"'$  (1.753 m)      Body mass index is 23.41 kg/m.  Physical Exam:   Physical Exam Vitals and nursing note reviewed.  Constitutional:      General: He is not in acute distress.    Appearance: He is well-developed. He is not ill-appearing or toxic-appearing.  HENT:     Head: Normocephalic and atraumatic.     Right Ear: Tympanic membrane, ear canal and external ear normal. There is impacted cerumen. Tympanic membrane is not erythematous, retracted or bulging.     Left Ear: Tympanic membrane, ear canal and external ear normal. There is impacted cerumen. Tympanic membrane is not erythematous, retracted or bulging.     Nose: Nose normal.     Right Sinus: No maxillary sinus tenderness or frontal sinus tenderness.     Left Sinus: No maxillary sinus tenderness or frontal sinus tenderness.     Mouth/Throat:     Pharynx: Uvula midline. No posterior oropharyngeal erythema.  Eyes:     General: Lids are normal.     Conjunctiva/sclera: Conjunctivae normal.  Neck:     Trachea: Trachea normal.  Cardiovascular:     Rate and Rhythm: Normal rate and regular rhythm.     Pulses: Normal pulses.     Heart sounds: Normal heart sounds, S1 normal and S2 normal.  Pulmonary:     Effort: Pulmonary effort is normal.     Breath sounds: Normal breath sounds. No decreased breath sounds, wheezing, rhonchi or rales.  Lymphadenopathy:     Cervical: No cervical adenopathy.  Skin:    General: Skin is warm and dry.  Neurological:     Mental Status: He is alert.     GCS: GCS eye subscore is 4. GCS verbal subscore is 5. GCS motor subscore is 6.  Psychiatric:        Speech: Speech normal.        Behavior: Behavior normal.  Behavior is cooperative.    Ceruminosis is noted.  Wax is removed by syringing and manual debridement.   Assessment and Plan:   Hypertension, unspecified type Above goal in office but numbers at home are overall controlled Continue lisinopril 20 mg daily Will order zio patch x 7 days to evaluate for  possible arrhythmia Will order calcium score as well to rule any obstructive concern  HLD Compliant with pravastatin 20 mg daily Will order calcium score as well to rule any obstructive concern  Indigestion Overall improved since ER  Bilateral impacted cerumen Tolerated well No evidence of infection on exam  I,Savera Zaman,acting as a scribe for [employer], PA.,have documented all relevant documentation on the behalf of [name], PA,as directed by  [name], PA while in the presence of [name], [place].   I, [name], [place], have reviewed all documentation for this visit. The documentation on 02/11/22 for the exam, diagnosis, procedures, and orders are all accurate and complete.   [name], PA-C

## 2022-02-15 DIAGNOSIS — I10 Essential (primary) hypertension: Secondary | ICD-10-CM | POA: Diagnosis not present

## 2022-03-02 ENCOUNTER — Ambulatory Visit: Payer: Self-pay | Admitting: Surgery

## 2022-03-02 NOTE — H&P (Signed)
Subjective    Chief Complaint: Hernia       History of Present Illness: Kenneth Salazar is a 76 y.o. male who is seen today as an office consultation at the request of [name], PA-C for evaluation of Hernia .     This is a 76 year old male in good health who has previous vasectomy, right inguinal hernia repair with mesh in 2009 as well as prostatectomy 2016 who presents with a bulge in his left groin.  This has been present for at least 7 years.  Recently, it has become larger and more uncomfortable.  He remains reducible.  He denies any obstructive symptoms.  No recent imaging.  He presents now to discuss repair.     Review of Systems: A complete review of systems was obtained from the patient.  I have reviewed this information and discussed as appropriate with the patient.  See HPI as well for other ROS.   Review of Systems  Constitutional: Negative.   HENT:  Positive for tinnitus.   Eyes: Negative.   Respiratory: Negative.    Cardiovascular: Negative.   Gastrointestinal: Negative.   Genitourinary: Negative.   Musculoskeletal: Negative.   Skin: Negative.   Neurological: Negative.   Endo/Heme/Allergies: Negative.   Psychiatric/Behavioral: Negative.         Medical History: Past Medical History      Past Medical History:  Diagnosis Date   Arthritis     History of cancer     Hypertension             Patient Active Problem List  Diagnosis   Arthralgia of both hands   Erectile dysfunction   HLD (hyperlipidemia)   Male stress incontinence   Malignant neoplasm of prostate (CMS-HCC)   Primary osteoarthritis of left shoulder   Left inguinal hernia      Past Surgical History       Past Surgical History:  Procedure Laterality Date   HERNIA REPAIR       PROSTATE SURGERY            Allergies       Allergies  Allergen Reactions   Sulfa (Sulfonamide Antibiotics) Hives and Other (See Comments)      unknown   Oxycodone-Acetaminophen Palpitations               Current Outpatient Medications on File Prior to Visit  Medication Sig Dispense Refill   pravastatin (PRAVACHOL) 20 MG tablet Take 1 tablet by mouth once daily       lisinopriL (ZESTRIL) 20 MG tablet Take 20 mg by mouth once daily        No current facility-administered medications on file prior to visit.      Family History       Family History  Problem Relation Age of Onset   High blood pressure (Hypertension) Mother     Diabetes Mother          Social History       Tobacco Use  Smoking Status Never  Smokeless Tobacco Not on file      Social History  Social History        Socioeconomic History   Marital status: Married  Tobacco Use   Smoking status: Never  Substance and Sexual Activity   Alcohol use: Yes   Drug use: Never        Objective:         Vitals:    03/02/22 1014  BP: (!) 148/88  Pulse: 81  Temp: 37.1 C (98.8 F)  SpO2: 98%  Weight: 72.4 kg (159 lb 9.6 oz)  Height: 175.3 cm ('5\' 9"'$ )    Body mass index is 23.57 kg/m.   Physical Exam    Constitutional:  WDWN in NAD, conversant, no obvious deformities; lying in bed comfortably Eyes:  Pupils equal, round; sclera anicteric; moist conjunctiva; no lid lag HENT:  Oral mucosa moist; good dentition  Neck:  No masses palpated, trachea midline; no thyromegaly Lungs:  CTA bilaterally; normal respiratory effort CV:  Regular rate and rhythm; no murmurs; extremities well-perfused with no edema Abd:  +bowel sounds, soft, non-tender, no palpable organomegaly; no palpable hernias GU: Bilateral descended testes, no testicular masses, no sign of right inguinal hernia, protruding reducible left inguinal hernia Musc: Normal gait; no apparent clubbing or cyanosis in extremities Lymphatic:  No palpable cervical or axillary lymphadenopathy Skin:  Warm, dry; no sign of jaundice Psychiatric - alert and oriented x 4; calm mood and affect     Assessment and Plan:  Diagnoses and all orders for this  visit:   Left inguinal hernia       Left inguinal hernia repair with mesh.The surgical procedure has been discussed with the patient.  Potential risks, benefits, alternative treatments, and expected outcomes have been explained.  All of the patient's questions at this time have been answered.  The likelihood of reaching the patient's treatment goal is good.  The patient understand the proposed surgical procedure and wishes to proceed.         Carlean Jews, MD  03/02/2022 12:19 PM

## 2022-03-10 ENCOUNTER — Ambulatory Visit (HOSPITAL_BASED_OUTPATIENT_CLINIC_OR_DEPARTMENT_OTHER)
Admission: RE | Admit: 2022-03-10 | Discharge: 2022-03-10 | Disposition: A | Discharge status: Home / Self Care | Payer: Medicare Other | Source: Ambulatory Visit | Attending: Physician Assistant | Admitting: Physician Assistant

## 2022-03-10 DIAGNOSIS — I10 Essential (primary) hypertension: Secondary | ICD-10-CM | POA: Insufficient documentation

## 2022-04-05 ENCOUNTER — Encounter: Payer: Self-pay | Admitting: Physician Assistant

## 2022-04-13 ENCOUNTER — Other Ambulatory Visit: Payer: Self-pay | Admitting: Physician Assistant

## 2022-04-27 ENCOUNTER — Other Ambulatory Visit: Payer: Self-pay | Admitting: Physician Assistant

## 2022-06-25 ENCOUNTER — Ambulatory Visit (INDEPENDENT_AMBULATORY_CARE_PROVIDER_SITE_OTHER): Payer: Medicare Other | Admitting: Physician Assistant

## 2022-06-25 ENCOUNTER — Encounter: Payer: Self-pay | Admitting: Physician Assistant

## 2022-06-25 VITALS — BP 140/74 | HR 82 | Temp 97.7°F | Resp 16 | Ht 67.0 in | Wt 153.8 lb

## 2022-06-25 DIAGNOSIS — Z8546 Personal history of malignant neoplasm of prostate: Secondary | ICD-10-CM | POA: Diagnosis not present

## 2022-06-25 DIAGNOSIS — E785 Hyperlipidemia, unspecified: Secondary | ICD-10-CM

## 2022-06-25 DIAGNOSIS — I10 Essential (primary) hypertension: Secondary | ICD-10-CM | POA: Diagnosis not present

## 2022-06-25 DIAGNOSIS — E88819 Insulin resistance, unspecified: Secondary | ICD-10-CM | POA: Diagnosis not present

## 2022-06-25 DIAGNOSIS — Z0000 Encounter for general adult medical examination without abnormal findings: Secondary | ICD-10-CM | POA: Diagnosis not present

## 2022-06-25 LAB — LIPID PANEL
Cholesterol: 142 mg/dL (ref 0–200)
HDL: 67 mg/dL (ref >39.00)
LDL Cholesterol: 49 mg/dL (ref 0–99)
NonHDL: 74.64
Total CHOL/HDL Ratio: 2
Triglycerides: 130 mg/dL (ref 0.0–149.0)
VLDL: 26 mg/dL (ref 0.0–40.0)

## 2022-06-25 LAB — HEMOGLOBIN A1C: Hgb A1c MFr Bld: 6.4 % (ref 4.6–6.5)

## 2022-06-25 LAB — PSA: PSA: 0 ng/mL — ABNORMAL LOW (ref 0.10–4.00)

## 2022-06-25 NOTE — Progress Notes (Signed)
Subjective:    Kenneth Salazar is a 76 y.o. male and is here for a comprehensive physical exam.  HPI  Health Maintenance Due  Topic Date Due   Medicare Annual Wellness (AWV)  06/26/2022    Acute Concerns: None  Chronic Issues: HTN Currently taking lisinopril 10 mg. At home blood pressure readings are: around 120/80. Patient denies chest pain, SOB, blurred vision, dizziness, unusual headaches, lower leg swelling. Patient is compliant with medication. Denies excessive caffeine intake, stimulant usage, excessive alcohol intake, or increase in salt consumption.  BP Readings from Last 3 Encounters:  06/25/22 (!) 140/74  02/11/22 (!) 164/80  02/09/22 (!) 190/103   HLD  Currently taking pravastatin 20 mg daily Denies concerns  Hx of Prostate Cancer Continues to have nocturia 5-6 x per night Does not see urology and does not want to if not needed   Health Maintenance: Immunizations -- UTD Colonoscopy -- UTD PSA --  Lab Results  Component Value Date   PSA 0.00 (L) 06/20/2021   PSA 0.04 06/18/2020   PSA 0.00 (L) 06/16/2019   Diet -- enjoys sweets Sleep habits -- gets up at least 5-6 times per night Exercise -- stretching exercises, strengthening; 1 mile walk daily  Weight -- Weight: 153 lb 12.8 oz (69.8 kg)  Recent weight history Wt Readings from Last 10 Encounters:  06/25/22 153 lb 12.8 oz (69.8 kg)  02/11/22 158 lb 8 oz (71.9 kg)  02/09/22 159 lb (72.1 kg)  09/22/21 165 lb 6.4 oz (75 kg)  06/20/21 162 lb 4 oz (73.6 kg)  01/31/21 163 lb 3.2 oz (74 kg)  12/25/20 164 lb (74.4 kg)  12/19/20 164 lb 9.6 oz (74.7 kg)  07/19/20 163 lb (73.9 kg)  07/01/20 163 lb (73.9 kg)   Body mass index is 24.09 kg/m.  Mood -- stable Alcohol use --  reports current alcohol use.  Tobacco use --  Tobacco Use: Low Risk  (06/25/2022)   Patient History    Smoking Tobacco Use: Never    Smokeless Tobacco Use: Never    Passive Exposure: Not on file    Eligible for Low Dose CT?  no  UTD with eye doctor? UTD UTD with dentist? UTD     06/25/2022    9:47 AM  Depression screen PHQ 2/9  Decreased Interest 0  Down, Depressed, Hopeless 0  PHQ - 2 Score 0    Other providers/specialists: Patient Care Team: [name], [place] as PCP - General (Physician Assistant) Gastroenterology, Sadie Haber as Consulting Physician (Gastroenterology) Juluis Rainier as Consulting Physician (Optometry) Dentistry, [facility] as Consulting Physician (Dentistry)    PMHx, SurgHx, SocialHx, Medications, and Allergies were reviewed in the Visit Navigator and updated as appropriate.   Past Medical History:  Diagnosis Date   Allergy 2005   Arthritis    Cancer [facility])    prostate 01-2015   Hyperlipidemia    Hypertension    no meds     Past Surgical History:  Procedure Laterality Date   COLONOSCOPY  2013, 07/2015   Dr.Schooler   fatty tissue removed chin     HERNIA REPAIR     MINOR EXCISION OF ORAL LESION     PROSTATE SURGERY     reomoved 01-2015   VASECTOMY  1977     Family History  Problem Relation Age of Onset   Diabetes Mother    Hearing loss Mother    Heart disease Mother    Hypertension Mother    Obesity Mother  Stroke Father    Diabetes Maternal Uncle    Colon cancer Neg Hx    Colon polyps Neg Hx    Esophageal cancer Neg Hx    Rectal cancer Neg Hx    Stomach cancer Neg Hx     Social History   Tobacco Use   Smoking status: Never   Smokeless tobacco: Never  Vaping Use   Vaping Use: Never used  Substance Use Topics   Alcohol use: Yes    Comment: Rarely   Drug use: No    Review of Systems:   Review of Systems  Constitutional:  Negative for chills, fever, malaise/fatigue and weight loss.  HENT:  Negative for hearing loss, sinus pain and sore throat.   Respiratory:  Negative for cough and hemoptysis.   Cardiovascular:  Negative for chest pain, palpitations, leg swelling and PND.  Gastrointestinal:  Negative for abdominal pain,  constipation, diarrhea, heartburn, nausea and vomiting.  Genitourinary:  Negative for dysuria, frequency and urgency.  Musculoskeletal:  Negative for back pain, myalgias and neck pain.  Skin:  Negative for itching and rash.  Neurological:  Negative for dizziness, tingling, seizures and headaches.  Endo/Heme/Allergies:  Negative for polydipsia.  Psychiatric/Behavioral:  Negative for depression. The patient is not nervous/anxious.     Objective:    Vitals:   06/25/22 0948 06/25/22 1015  BP: (!) 176/87 (!) 140/74  Pulse: 82   Resp: 16   Temp: 97.7 F (36.5 C)   SpO2: 100%     Body mass index is 24.09 kg/m.  General  Alert, cooperative, no distress, appears stated age  Head:  Normocephalic, without obvious abnormality, atraumatic  Eyes:  PERRL, conjunctiva/corneas clear, EOM's intact, fundi benign, both eyes       Ears:  Normal TM's and external ear canals, both ears  Nose: Nares normal, septum midline, mucosa normal, no drainage or sinus tenderness  Throat: Lips, mucosa, and tongue normal; teeth and gums normal  Neck: Supple, symmetrical, trachea midline, no adenopathy;     thyroid:  No enlargement/tenderness/nodules; no carotid bruit or JVD  Back:   Symmetric, no curvature, ROM normal, no CVA tenderness  Lungs:   Clear to auscultation bilaterally, respirations unlabored  Chest wall:  No tenderness or deformity  Heart:  Regular rate and rhythm, S1 and S2 normal, no murmur, rub or gallop  Abdomen:   Soft, non-tender, bowel sounds active all four quadrants, no masses, no organomegaly  Extremities: Extremities normal, atraumatic, no cyanosis or edema  Prostate : Deferred   Skin: Skin color, texture, turgor normal, no rashes or lesions  Lymph nodes: Cervical, supraclavicular, and axillary nodes normal  Neurologic: CNII-XII grossly intact. Normal strength, sensation and reflexes throughout   AssessmentPlan:   Routine physical examination Today patient counseled on age  appropriate routine health concerns for screening and prevention, each reviewed and up to date or declined. Immunizations reviewed and up to date or declined. Labs ordered and reviewed. Risk factors for depression reviewed and negative. Hearing function and visual acuity are intact. ADLs screened and addressed as needed. Functional ability and level of safety reviewed and appropriate. Education, counseling and referrals performed based on assessed risks today. Patient provided with a copy of personalized plan for preventive services.  Hypertension, unspecified type Normotensive Continue lisinopril 10 mg daily Follow-up in 6 months, sooner if concerns  Insulin resistance Update A1c and provide recommendations accordingly  History of prostate cancer Update PSA and low threshold to send to urology  Hyperlipidemia, unspecified hyperlipidemia type  Update lipid panel and adjust pravastatin as indicated  [name], PA-C [place]

## 2022-06-25 NOTE — Patient Instructions (Signed)
It was great to see you! ? ?Please go to the lab for blood work.  ? ?Our office will call you with your results unless you have chosen to receive results via MyChart. ? ?If your blood work is normal we will follow-up each year for physicals and as scheduled for chronic medical problems. ? ?If anything is abnormal we will treat accordingly and get you in for a follow-up. ? ?Take care, ? ?Triton Heidrich ?  ? ? ?

## 2022-06-29 ENCOUNTER — Ambulatory Visit (INDEPENDENT_AMBULATORY_CARE_PROVIDER_SITE_OTHER): Payer: Medicare Other

## 2022-06-29 DIAGNOSIS — Z0000 Encounter for general adult medical examination without abnormal findings: Secondary | ICD-10-CM

## 2022-06-29 NOTE — Patient Instructions (Signed)
[name] , Thank you for taking time to come for your Medicare Wellness Visit. I appreciate your ongoing commitment to your health goals. Please review the following plan we discussed and let me know if I can assist you in the future.   These are the goals we discussed:  Goals      Maintain current health status, not gain weight.     Patient Stated     Lose weight about 2lbs     Patient Stated     Lose about 5 lbs      Patient Stated     Stay healthy         This is a list of the screening recommended for you and due dates:  Health Maintenance  Topic Date Due   COVID-19 Vaccine (7 - 2023-24 season) 07/11/2022*   Medicare Annual Wellness Visit  06/30/2023   Colon Cancer Screening  07/20/2027   DTaP/Tdap/Td vaccine (3 - Td or Tdap) 06/14/2028   Pneumonia Vaccine  Completed   Flu Shot  Completed   Hepatitis C Screening: USPSTF Recommendation to screen - Ages 18-79 yo.  Completed   Zoster (Shingles) Vaccine  Completed   HPV Vaccine  Aged Out  *Topic was postponed. The date shown is not the original due date.    Advanced directives: copies in chart   Conditions/risks identified: stay healthy   Next appointment: Follow up in one year for your annual wellness visit.   Preventive Care 25 Years and Older, Male  Preventive care refers to lifestyle choices and visits with your health care provider that can promote health and wellness. What does preventive care include? A yearly physical exam. This is also called an annual well check. Dental exams once or twice a year. Routine eye exams. Ask your health care provider how often you should have your eyes checked. Personal lifestyle choices, including: Daily care of your teeth and gums. Regular physical activity. Eating a healthy diet. Avoiding tobacco and drug use. Limiting alcohol use. Practicing safe sex. Taking low doses of aspirin every day. Taking vitamin and mineral supplements as recommended by your health care  provider. What happens during an annual well check? The services and screenings done by your health care provider during your annual well check will depend on your age, overall health, lifestyle risk factors, and family history of disease. Counseling  Your health care provider may ask you questions about your: Alcohol use. Tobacco use. Drug use. Emotional well-being. Home and relationship well-being. Sexual activity. Eating habits. History of falls. Memory and ability to understand (cognition). Work and work Statistician. Screening  You may have the following tests or measurements: Height, weight, and BMI. Blood pressure. Lipid and cholesterol levels. These may be checked every 5 years, or more frequently if you are over 7 years old. Skin check. Lung cancer screening. You may have this screening every year starting at age 77 if you have a 30-pack-year history of smoking and currently smoke or have quit within the past 15 years. Fecal occult blood test (FOBT) of the stool. You may have this test every year starting at age 7. Flexible sigmoidoscopy or colonoscopy. You may have a sigmoidoscopy every 5 years or a colonoscopy every 10 years starting at age 20. Prostate cancer screening. Recommendations will vary depending on your family history and other risks. Hepatitis C blood test. Hepatitis B blood test. Sexually transmitted disease (STD) testing. Diabetes screening. This is done by checking your blood sugar (glucose) after you have  not eaten for a while (fasting). You may have this done every 1-3 years. Abdominal aortic aneurysm (AAA) screening. You may need this if you are a current or former smoker. Osteoporosis. You may be screened starting at age 59 if you are at high risk. Talk with your health care provider about your test results, treatment options, and if necessary, the need for more tests. Vaccines  Your health care provider may recommend certain vaccines, such  as: Influenza vaccine. This is recommended every year. Tetanus, diphtheria, and acellular pertussis (Tdap, Td) vaccine. You may need a Td booster every 10 years. Zoster vaccine. You may need this after age 39. Pneumococcal 13-valent conjugate (PCV13) vaccine. One dose is recommended after age 84. Pneumococcal polysaccharide (PPSV23) vaccine. One dose is recommended after age 19. Talk to your health care provider about which screenings and vaccines you need and how often you need them. This information is not intended to replace advice given to you by your health care provider. Make sure you discuss any questions you have with your health care provider. Document Released: 07/26/2015 Document Revised: 03/18/2016 Document Reviewed: 04/30/2015 Elsevier Interactive Patient Education  2017 [employer] Prevention in the Home Falls can cause injuries. They can happen to people of all ages. There are many things you can do to make your home safe and to help prevent falls. What can I do on the outside of my home? Regularly fix the edges of walkways and driveways and fix any cracks. Remove anything that might make you trip as you walk through a door, such as a raised step or threshold. Trim any bushes or trees on the path to your home. Use bright outdoor lighting. Clear any walking paths of anything that might make someone trip, such as rocks or tools. Regularly check to see if handrails are loose or broken. Make sure that both sides of any steps have handrails. Any raised decks and porches should have guardrails on the edges. Have any leaves, snow, or ice cleared regularly. Use sand or salt on walking paths during winter. Clean up any spills in your garage right away. This includes oil or grease spills. What can I do in the bathroom? Use night lights. Install grab bars by the toilet and in the tub and shower. Do not use towel bars as grab bars. Use non-skid mats or decals in the tub or  shower. If you need to sit down in the shower, use a plastic, non-slip stool. Keep the floor dry. Clean up any water that spills on the floor as soon as it happens. Remove soap buildup in the tub or shower regularly. Attach bath mats securely with double-sided non-slip rug tape. Do not have throw rugs and other things on the floor that can make you trip. What can I do in the bedroom? Use night lights. Make sure that you have a light by your bed that is easy to reach. Do not use any sheets or blankets that are too big for your bed. They should not hang down onto the floor. Have a firm chair that has side arms. You can use this for support while you get dressed. Do not have throw rugs and other things on the floor that can make you trip. What can I do in the kitchen? Clean up any spills right away. Avoid walking on wet floors. Keep items that you use a lot in easy-to-reach places. If you need to reach something above you, use a strong step stool  that has a grab bar. Keep electrical cords out of the way. Do not use floor polish or wax that makes floors slippery. If you must use wax, use non-skid floor wax. Do not have throw rugs and other things on the floor that can make you trip. What can I do with my stairs? Do not leave any items on the stairs. Make sure that there are handrails on both sides of the stairs and use them. Fix handrails that are broken or loose. Make sure that handrails are as long as the stairways. Check any carpeting to make sure that it is firmly attached to the stairs. Fix any carpet that is loose or worn. Avoid having throw rugs at the top or bottom of the stairs. If you do have throw rugs, attach them to the floor with carpet tape. Make sure that you have a light switch at the top of the stairs and the bottom of the stairs. If you do not have them, ask someone to add them for you. What else can I do to help prevent falls? Wear shoes that: Do not have high heels. Have  rubber bottoms. Are comfortable and fit you well. Are closed at the toe. Do not wear sandals. If you use a stepladder: Make sure that it is fully opened. Do not climb a closed stepladder. Make sure that both sides of the stepladder are locked into place. Ask someone to hold it for you, if possible. Clearly mark and make sure that you can see: Any grab bars or handrails. First and last steps. Where the edge of each step is. Use tools that help you move around (mobility aids) if they are needed. These include: Canes. Walkers. Scooters. Crutches. Turn on the lights when you go into a dark area. Replace any light bulbs as soon as they burn out. Set up your furniture so you have a clear path. Avoid moving your furniture around. If any of your floors are uneven, fix them. If there are any pets around you, be aware of where they are. Review your medicines with your doctor. Some medicines can make you feel dizzy. This can increase your chance of falling. Ask your doctor what other things that you can do to help prevent falls. This information is not intended to replace advice given to you by your health care provider. Make sure you discuss any questions you have with your health care provider. Document Released: 04/25/2009 Document Revised: 12/05/2015 Document Reviewed: 08/03/2014 Elsevier Interactive Patient Education  2017 [employer].

## 2022-06-29 NOTE — Progress Notes (Signed)
I connected with  Izell Runnels on 06/29/22 by a audio enabled telemedicine application and verified that I am speaking with the correct person using two identifiers.  Patient Location: Home  Provider Location: Office/Clinic  I discussed the limitations of evaluation and management by telemedicine. The patient expressed understanding and agreed to proceed.   Subjective:   Alano Blasco is a 76 y.o. male who presents for Medicare Annual/Subsequent preventive examination.  Review of Systems     Cardiac Risk Factors include: advanced age (>29mn, >>58women);dyslipidemia;hypertension;male gender     Objective:    There were no vitals filed for this visit. There is no height or weight on file to calculate BMI.     06/29/2022    2:23 PM 06/26/2021    2:34 PM 06/19/2019    1:48 PM 06/14/2018    1:14 PM 06/07/2017    9:04 AM 01/14/2016    1:47 PM  Advanced Directives  Does Patient Have a Medical Advance Directive? Yes Yes Yes Yes Yes Yes  Type of AParamed[place]of [place] will Healthcare Power of ACornwells HeightsLiving will HWilmington ManorLiving will [place]Living will [place]Living will;Out of facility DNR (pink MOST or yellow form)  Does patient want to make changes to medical advance directive? No - Patient declined  No - Patient declined No - Patient declined No - Patient declined No - Patient declined  Copy of HGreeleyin Chart? Yes - validated most recent copy scanned in chart (See row information) Yes - validated most recent copy scanned in chart (See row information) Yes - validated most recent copy scanned in chart (See row information) Yes - validated most recent copy scanned in chart (See row information) Yes No - copy requested    Current Medications (verified) Outpatient Encounter Medications as of 06/29/2022  Medication Sig   aspirin EC 81 MG  tablet Take 81 mg by mouth daily.    ipratropium (ATROVENT) 0.06 % nasal spray USE 2 SPRAYS IN BOTH NOSTRILS 4  TIMES DAILY   lisinopril (ZESTRIL) 10 MG tablet TAKE 1 TABLET BY MOUTH DAILY (Patient taking differently: Take 20 mg by mouth daily.)   Multiple Vitamin (MULTIVITAMIN) tablet Take 1 tablet by mouth daily.    Multiple Vitamins-Minerals (PRESERVISION AREDS PO) Take by mouth.   pravastatin (PRAVACHOL) 20 MG tablet TAKE 1 TABLET BY MOUTH DAILY   Psyllium (METAMUCIL MULTIHEALTH FIBER PO)    No facility-administered encounter medications on file as of 06/29/2022.    Allergies (verified) Sulfa antibiotics; Antihistamines, chlorpheniramine-type; Antihistamines, diphenhydramine-type; Antihistamines, loratadine-type; and Percocet [oxycodone-acetaminophen]   History: Past Medical History:  Diagnosis Date   Allergy 2005   Arthritis    Cancer ([facility]    prostate 01-2015   Hyperlipidemia    Hypertension    no meds   Past Surgical History:  Procedure Laterality Date   COLONOSCOPY  2013, 07/2015   Dr.Schooler   fatty tissue removed chin     HERNIA REPAIR     MINOR EXCISION OF ORAL LESION     PROSTATE SURGERY     reomoved 01-2015   VASECTOMY  1977   Family History  Problem Relation Age of Onset   Diabetes Mother    Hearing loss Mother    Heart disease Mother    Hypertension Mother    Obesity Mother    Stroke Father    Diabetes Maternal Uncle    Colon cancer Neg Hx  Colon polyps Neg Hx    Esophageal cancer Neg Hx    Rectal cancer Neg Hx    Stomach cancer Neg Hx    Social History   Socioeconomic History   Marital status: Married    Spouse name: Not on file   Number of children: Not on file   Years of education: Not on file   Highest education level: Not on file  Occupational History   Occupation: retired    Comment: maintenance via computer   Tobacco Use   Smoking status: Never   Smokeless tobacco: Never  Vaping Use   Vaping Use: Never used  Substance and Sexual  Activity   Alcohol use: Yes    Comment: Rarely   Drug use: No   Sexual activity: Not Currently  Other Topics Concern   Not on file  Social History Narrative   Retired at age 76   Worked for telephone company   Married   3 children, 1 grandkid   Social Determinants of Health   Financial Resource Strain: [place]  (06/29/2022)   Overall Financial Resource Strain (CARDIA)    Difficulty of Paying Living Expenses: Not hard at all  Food Insecurity: No Food Insecurity (06/29/2022)   Hunger Vital Sign    Worried About Running Out of Food in the Last Year: Never true    [place] in the Last Year: Never true  Transportation Needs: No Transportation Needs (06/29/2022)   PRAPARE - Hydrologist (Medical): No    Lack of Transportation (Non-Medical): No  Physical Activity: Sufficiently Active (06/29/2022)   Exercise Vital Sign    Days of Exercise per Week: 7 days    Minutes of Exercise per Session: 50 min  Stress: No Stress Concern Present (06/29/2022)   [place]    Feeling of Stress : Not at all  Social Connections: [place] (06/29/2022)   Social Connection and Isolation Panel [NHANES]    Frequency of Communication with Friends and Family: Never    Frequency of Social Gatherings with Friends and Family: More than three times a week    Attends Religious Services: More than 4 times per year    Active Member of [employer] or Organizations: Yes    Attends Archivist Meetings: 1 to 4 times per year    Marital Status: Married    Tobacco Counseling Counseling given: Not Answered   Clinical Intake:  Pre-visit preparation completed: Yes  Pain : No/denies pain     BMI - recorded: 24.09 Nutritional Status: BMI of 19-24  Normal Nutritional Risks: None Diabetes: No  How often do you need to have someone help you when you read instructions, pamphlets, or other written  materials from your doctor or pharmacy?: 1 - Never  Diabetic?no  Interpreter Needed?: No  Information entered by :: Charlott Rakes, LPN   Activities of Daily Living    06/29/2022    2:24 PM 06/22/2022    1:56 PM  In your present state of health, do you have any difficulty performing the following activities:  Hearing? 1 0  Comment hearing aids   Vision? 0 0  Difficulty concentrating or making decisions? 0 1  Walking or climbing stairs? 0 0  Dressing or bathing? 0 0  Doing errands, shopping? 0 0  Preparing Food and eating ? N N  Using the Toilet? N N  In the past six months, have you accidently  leaked urine? N N  Do you have problems with loss of bowel control? N N  Managing your Medications? N N  Managing your Finances? N N  Housekeeping or managing your Housekeeping? N N    Patient Care Team: [name], [place] as PCP - General (Physician Assistant) Gastroenterology, Sadie Haber as Consulting Physician (Gastroenterology) Juluis Rainier as Consulting Physician (Optometry) Dentistry, [facility] as Consulting Physician (Dentistry)  Indicate any recent Medical Services you may have received from other than [facility] providers in the past year (date may be approximate).     Assessment:   This is a routine wellness examination for Ch[place].  Hearing/Vision screen Hearing Screening - Comments:: Pt wears hearing aids  Vision Screening - Comments:: Pt follows up with Dr Alois Cliche   Dietary issues and exercise activities discussed: Current Exercise Habits: Home exercise routine, Type of exercise: walking;Other - see comments, Time (Minutes): 50, Frequency (Times/Week): 7, Weekly Exercise (Minutes/Week): 350   Goals Addressed             This Visit's Progress    Patient Stated       Stay healthy        Depression Screen    06/29/2022    2:21 PM 06/25/2022    9:47 AM 06/26/2021    2:32 PM 06/20/2021    9:24 AM 12/19/2020    4:19 PM 06/20/2020   11:42 AM  06/18/2020    9:18 AM  PHQ 2/9 Scores  PHQ - 2 Score 0 0 0 1 0 0 0  PHQ- 9 Score    3       Fall Risk    06/29/2022    2:24 PM 06/25/2022    9:47 AM 06/22/2022    1:56 PM 02/11/2022    9:24 AM 12/19/2020    4:19 PM  Fall Risk   Falls in the past year? 0 0 0 0 0  Number falls in past yr: 0 0 0 0 0  Injury with Fall? 0 0 0 0 0  Risk for fall due to : Impaired vision No Fall Risks     Follow up Falls prevention discussed   Falls evaluation completed     FALL RISK PREVENTION PERTAINING TO THE HOME:  Any stairs in or around the home? Yes  If so, are there any without handrails? No  Home free of loose throw rugs in walkways, pet beds, electrical cords, etc? Yes  Adequate lighting in your home to reduce risk of falls? Yes   ASSISTIVE DEVICES UTILIZED TO PREVENT FALLS:  Life alert? No  Use of a cane, walker or w/c? No  Grab bars in the bathroom? Yes  Shower chair or bench in shower? No  Elevated toilet seat or a handicapped toilet? No   TIMED UP AND GO:  Was the test performed? No .   Cognitive Function:    06/07/2017    9:14 AM  MMSE - Mini Mental State Exam  Orientation to time 5  Orientation to Place 5  Registration 3  Attention/ Calculation 5  Recall 3  Language- name 2 objects 2  Language- repeat 1  Language- follow 3 step command 3  Language- read & follow direction 1  Write a sentence 1  Copy design 1  Total score 30        06/29/2022    2:25 PM 06/20/2020   11:47 AM 06/19/2019    1:49 PM  6CIT Screen  What Year? 0 points 0 points  0 points  What month? 0 points 0 points 0 points  What time? 0 points  0 points  Count back from 20 0 points 0 points 0 points  Months in reverse 0 points 0 points 0 points  Repeat phrase 0 points 2 points 2 points  Total Score 0 points  2 points    Immunizations Immunization History  Administered Date(s) Administered   Fluad Quad(high Dose 65+) 03/21/2019, 03/31/2021, 04/03/2022   Influenza, High Dose Seasonal PF  06/07/2017, 04/14/2018, 03/14/2020   Influenza-Unspecified 04/21/2016, 03/31/2021   Moderna SARS-COV2 Booster Vaccination 04/03/2022   PFIZER(Purple Top)SARS-COV-2 Vaccination 08/17/2019, 09/11/2019, 04/09/2020, 11/12/2020, 04/01/2021, 11/29/2021   Pneumococcal Conjugate-13 05/18/2014   Pneumococcal Polysaccharide-23 01/29/2009   Td 01/17/2008   Tdap 06/14/2018   Zoster Recombinat (Shingrix) 08/04/2018, 01/03/2019   Zoster, Live 01/17/2008    TDAP status: Up to date  Flu Vaccine status: Up to date  Pneumococcal vaccine status: Up to date  Covid-19 vaccine status: Completed vaccines  Qualifies for Shingles Vaccine? Yes   Zostavax completed Yes   Shingrix Completed?: Yes  Screening Tests Health Maintenance  Topic Date Due   COVID-19 Vaccine (7 - 2023-24 season) 07/11/2022 (Originally 05/29/2022)   Medicare Annual Wellness ([place])  06/30/2023   COLONOSCOPY (Pts 45-62yr Insurance coverage will need to be confirmed)  07/20/2027   DTaP/Tdap/Td (3 - Td or Tdap) 06/14/2028   Pneumonia Vaccine 76 Years old  Completed   INFLUENZA VACCINE  Completed   Hepatitis C Screening  Completed   Zoster Vaccines- Shingrix  Completed   HPV VACCINES  Aged Out    Health Maintenance  There are no preventive care reminders to display for this patient.   Colorectal cancer screening: Type of screening: Colonoscopy. Completed 07/19/20. Repeat every 7 years  Additional Screening:  Hepatitis C Screening:  Completed 06/10/17  Vision Screening: Recommended annual ophthalmology exams for early detection of glaucoma and other disorders of the eye. Is the patient up to date with their annual eye exam?  Yes  Who is the provider or what is the name of the office in which the patient attends annual eye exams? Dr PAlois Cliche If pt is not established with a provider, would they like to be referred to a provider to establish care? No .   Dental Screening: Recommended annual dental exams for proper oral  hygiene  Community Resource Referral / Chronic Care Management: CRR required this visit?  No   CCM required this visit?  No      Plan:     I have personally reviewed and noted the following in the patient's chart:   Medical and social history Use of alcohol, tobacco or illicit drugs  Current medications and supplements including opioid prescriptions. Patient is not currently taking opioid prescriptions. Functional ability and status Nutritional status Physical activity Advanced directives List of other physicians Hospitalizations, surgeries, and ER visits in previous 12 months Vitals Screenings to include cognitive, depression, and falls Referrals and appointments  In addition, I have reviewed and discussed with patient certain preventive protocols, quality metrics, and best practice recommendations. A written personalized care plan for preventive services as well as general preventive health recommendations were provided to patient.     TWillette Brace LPN   [id number]  Nurse Notes: none

## 2022-07-14 ENCOUNTER — Other Ambulatory Visit: Payer: Self-pay | Admitting: Physician Assistant

## 2022-08-05 DIAGNOSIS — R490 Dysphonia: Secondary | ICD-10-CM | POA: Diagnosis not present

## 2022-08-05 DIAGNOSIS — R09A2 Foreign body sensation, throat: Secondary | ICD-10-CM | POA: Diagnosis not present

## 2022-10-06 DIAGNOSIS — R09A2 Foreign body sensation, throat: Secondary | ICD-10-CM | POA: Diagnosis not present

## 2022-10-21 ENCOUNTER — Other Ambulatory Visit: Payer: Self-pay | Admitting: Physician Assistant

## 2023-01-03 IMAGING — DX DG KNEE AP/LAT W/ SUNRISE*R*
3 series · 3 of 3 positions shown · non-contrast
Comparison: None.

CLINICAL DATA: Pain

EXAM:
RIGHT KNEE 3 VIEWS

[knee ap]
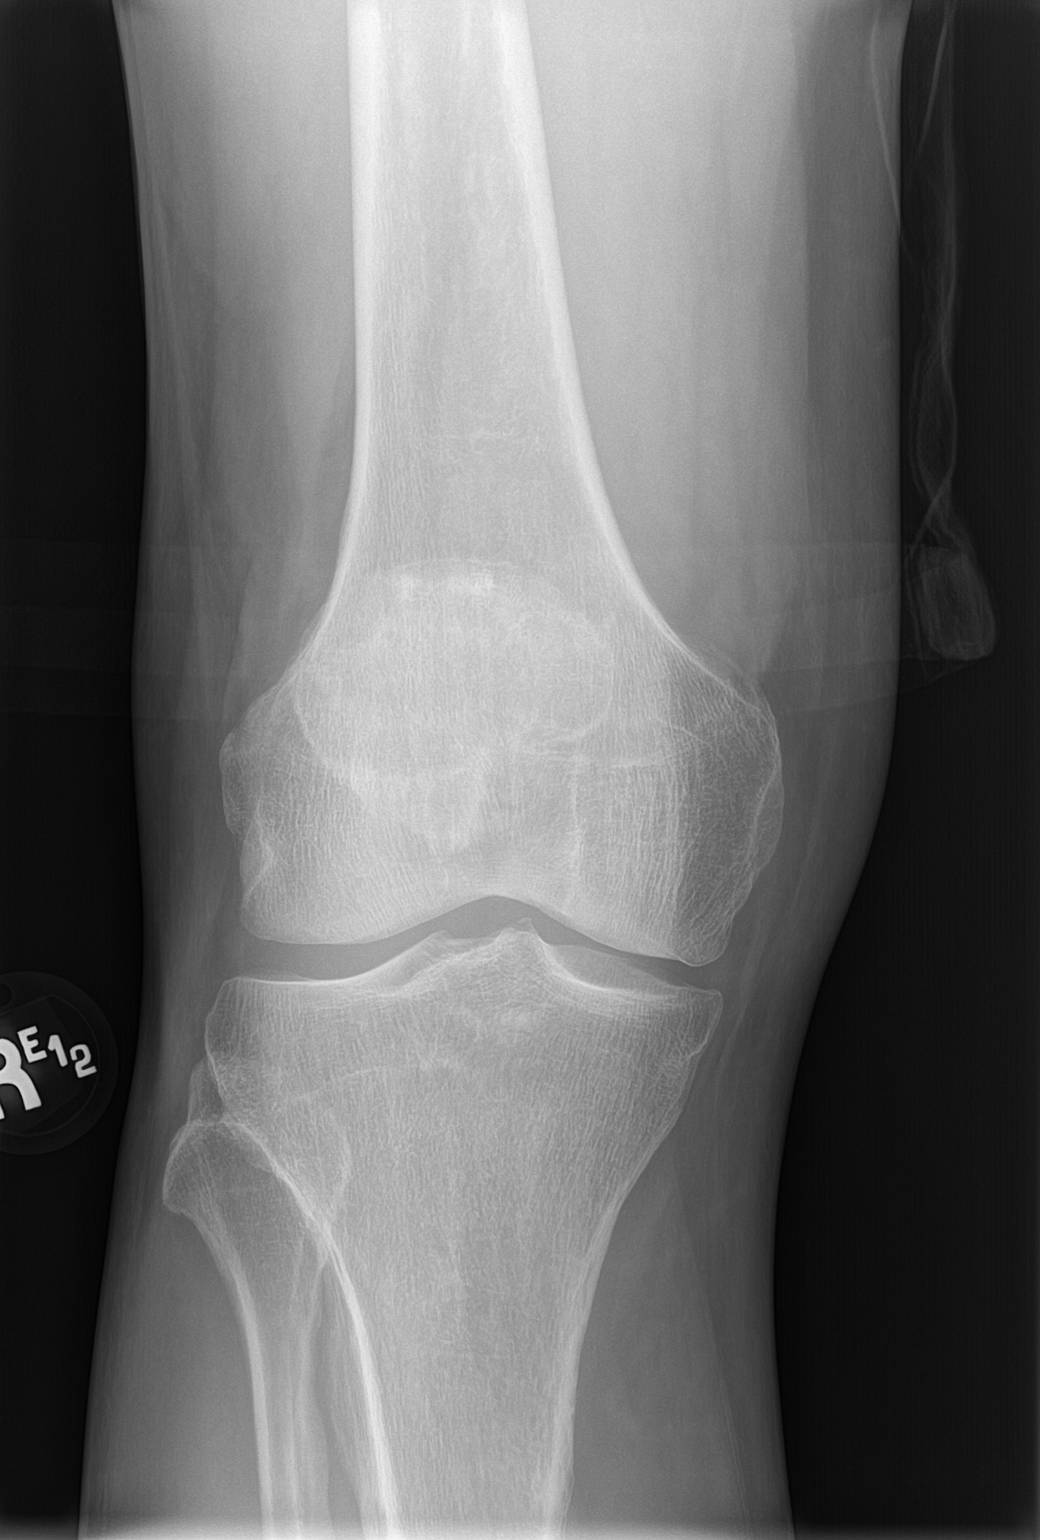

[knee lat]
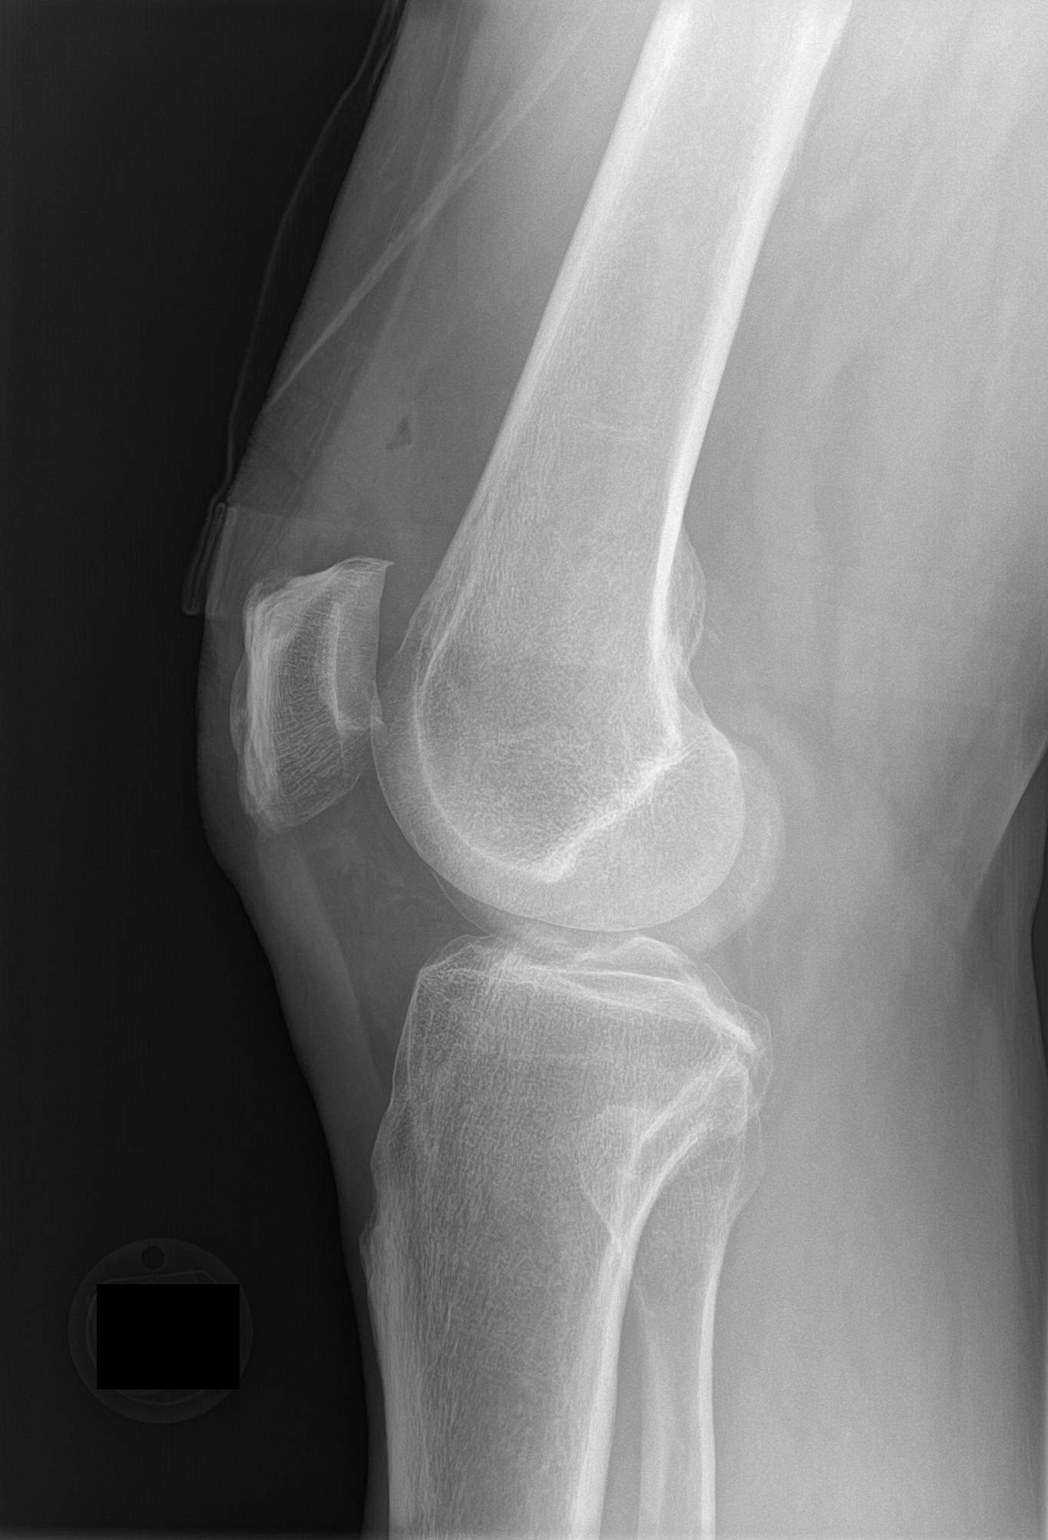

[patella]
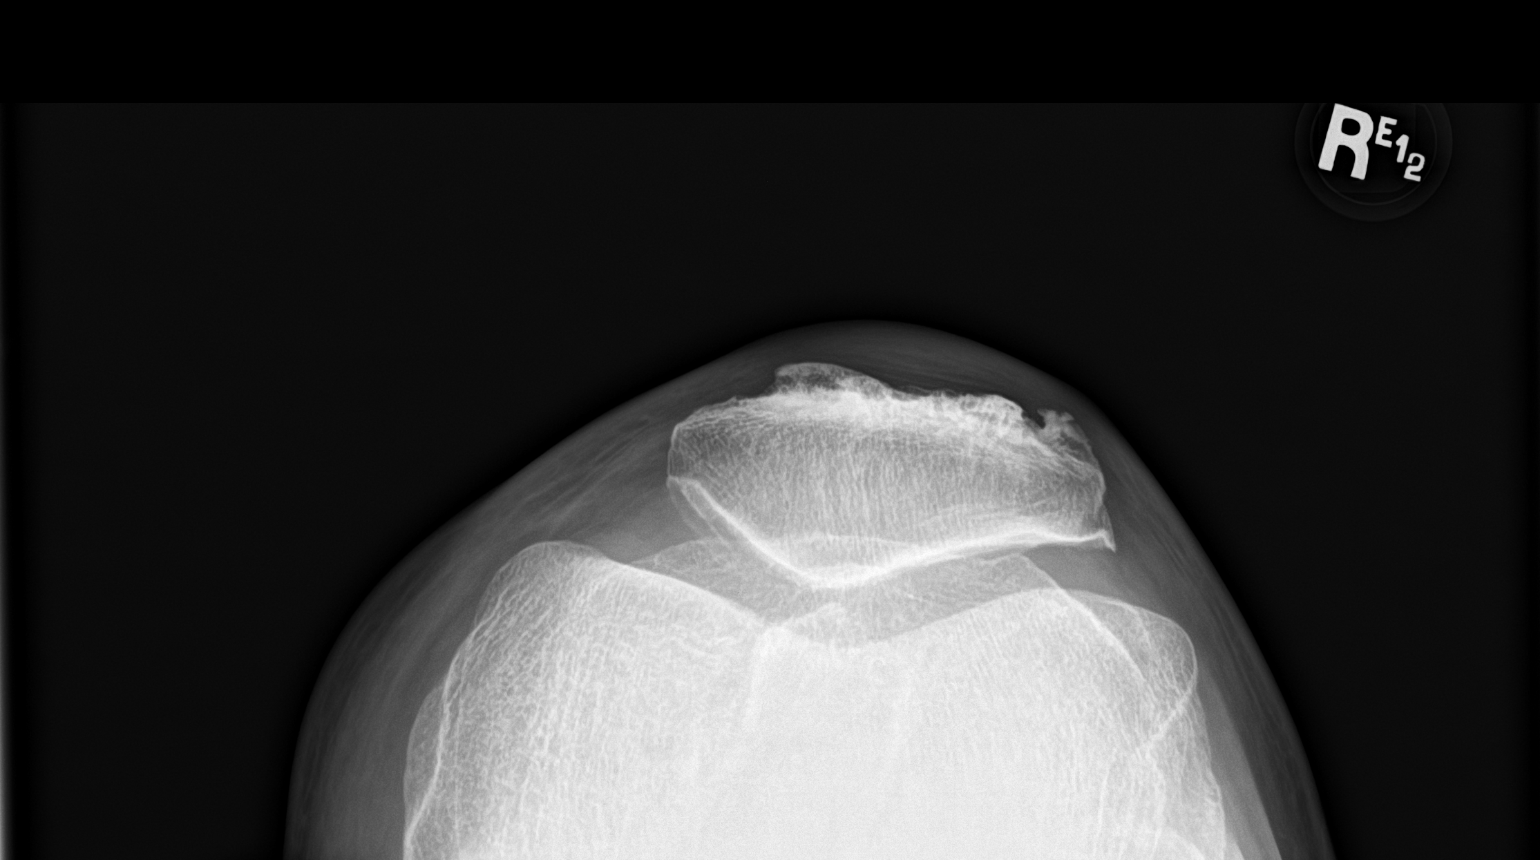

[3 of 3 positions shown; findings below may reference images not displayed]

FINDINGS: Frontal, lateral, and sunrise patellar images obtained. No
appreciable fracture, dislocation, or effusion. There is no
appreciable disc space narrowing. There is spurring along the
anterior and posterior aspects of the patella. There is a focus of
apparent soft tissue air in the suprapatellar bursa.
IMPRESSION: Small focus of apparent air in the suprapatellar bursa region.
Question recent trauma in this area. Infection in this area could
present in this manner. No associated pleural effusion, however.

Mild osteoarthritic change in the patellofemoral joint. No
appreciable joint space narrowing. No fracture or dislocation.

These results will be called to the ordering clinician or
representative by the Radiologist Assistant, and communication
documented in the PACS or [REDACTED].

## 2023-02-10 ENCOUNTER — Other Ambulatory Visit: Payer: Self-pay | Admitting: Physician Assistant

## 2023-03-18 ENCOUNTER — Encounter: Payer: Self-pay | Admitting: Physician Assistant

## 2023-05-04 ENCOUNTER — Other Ambulatory Visit: Payer: Self-pay | Admitting: Physician Assistant

## 2023-06-04 DIAGNOSIS — H353132 Nonexudative age-related macular degeneration, bilateral, intermediate dry stage: Secondary | ICD-10-CM | POA: Diagnosis not present

## 2023-06-04 LAB — HM DIABETES EYE EXAM

## 2023-06-28 ENCOUNTER — Ambulatory Visit (INDEPENDENT_AMBULATORY_CARE_PROVIDER_SITE_OTHER): Payer: Medicare Other | Admitting: Physician Assistant

## 2023-06-28 ENCOUNTER — Encounter: Payer: Self-pay | Admitting: Physician Assistant

## 2023-06-28 VITALS — BP 162/98 | HR 89 | Temp 98.0°F | Ht 67.0 in | Wt 154.5 lb

## 2023-06-28 DIAGNOSIS — E88819 Insulin resistance, unspecified: Secondary | ICD-10-CM | POA: Diagnosis not present

## 2023-06-28 DIAGNOSIS — Z0000 Encounter for general adult medical examination without abnormal findings: Secondary | ICD-10-CM | POA: Diagnosis not present

## 2023-06-28 DIAGNOSIS — Z8546 Personal history of malignant neoplasm of prostate: Secondary | ICD-10-CM | POA: Diagnosis not present

## 2023-06-28 DIAGNOSIS — E785 Hyperlipidemia, unspecified: Secondary | ICD-10-CM

## 2023-06-28 DIAGNOSIS — I10 Essential (primary) hypertension: Secondary | ICD-10-CM | POA: Diagnosis not present

## 2023-06-28 LAB — CBC WITH DIFFERENTIAL/PLATELET
Basophils Absolute: 0 10*3/uL (ref 0.0–0.1)
Basophils Relative: 0.5 % (ref 0.0–3.0)
Eosinophils Absolute: 0 10*3/uL (ref 0.0–0.7)
Eosinophils Relative: 0.9 % (ref 0.0–5.0)
HCT: 44 % (ref 39.0–52.0)
Hemoglobin: 15.1 g/dL (ref 13.0–17.0)
Lymphocytes Relative: 25.3 % (ref 12.0–46.0)
Lymphs Abs: 1.1 10*3/uL (ref 0.7–4.0)
MCHC: 34.3 g/dL (ref 30.0–36.0)
MCV: 92.2 fL (ref 78.0–100.0)
Monocytes Absolute: 0.4 10*3/uL (ref 0.1–1.0)
Monocytes Relative: 9.3 % (ref 3.0–12.0)
Neutro Abs: 2.9 10*3/uL (ref 1.4–7.7)
Neutrophils Relative %: 64 % (ref 43.0–77.0)
Platelets: 153 10*3/uL (ref 150.0–400.0)
RBC: 4.77 Mil/uL (ref 4.22–5.81)
RDW: 13.1 % (ref 11.5–15.5)
WBC: 4.5 10*3/uL (ref 4.0–10.5)

## 2023-06-28 LAB — COMPREHENSIVE METABOLIC PANEL
ALT: 35 U/L (ref 0–53)
AST: 23 U/L (ref 0–37)
Albumin: 4.2 g/dL (ref 3.5–5.2)
Alkaline Phosphatase: 70 U/L (ref 39–117)
BUN: 16 mg/dL (ref 6–23)
CO2: 29 meq/L (ref 19–32)
Calcium: 8.9 mg/dL (ref 8.4–10.5)
Chloride: 103 meq/L (ref 96–112)
Creatinine, Ser: 1.04 mg/dL (ref 0.40–1.50)
GFR: 68.08 mL/min (ref >60.00)
Glucose, Bld: 123 mg/dL — ABNORMAL HIGH (ref 70–99)
Potassium: 4.1 meq/L (ref 3.5–5.1)
Sodium: 140 meq/L (ref 135–145)
Total Bilirubin: 0.8 mg/dL (ref 0.2–1.2)
Total Protein: 6.4 g/dL (ref 6.0–8.3)

## 2023-06-28 LAB — LIPID PANEL
Cholesterol: 138 mg/dL (ref 0–200)
HDL: 59.9 mg/dL (ref >39.00)
LDL Cholesterol: 50 mg/dL (ref 0–99)
NonHDL: 78.18
Total CHOL/HDL Ratio: 2
Triglycerides: 143 mg/dL (ref 0.0–149.0)
VLDL: 28.6 mg/dL (ref 0.0–40.0)

## 2023-06-28 LAB — PSA: PSA: 0.01 ng/mL — ABNORMAL LOW (ref 0.10–4.00)

## 2023-06-28 LAB — HEMOGLOBIN A1C: Hgb A1c MFr Bld: 6.4 % (ref 4.6–6.5)

## 2023-06-28 NOTE — Progress Notes (Signed)
Aquino Galo is a 77 y.o. male and is here for a comprehensive physical exam.  HPI  Health Maintenance Due  Topic Date Due   Medicare Annual Wellness (AWV)  06/30/2023   Acute Concerns: None.  Chronic Issues: Hypertension: Managed/Compliant with 10 mg Lisinopril daily.  Monitored at home and with average readings of 128/80, with most recent at home reading being this morning  .162/98, average 128/80 Denies excessive caffeine intake, stimulant usage, excessive alcohol intake, or increase in salt consumption.  Denies chest pain, shortness of breath, blurred vision, dizziness, unusual headaches, or lower leg swelling.  BP Readings from Last 3 Encounters:  06/28/23 (!) 160/100  06/25/22 (!) 140/74  02/11/22 (!) 164/80   Hyperlipidemia: Managed/Compliant with 20 mg Pravastatin daily and daily ASA -- denies concerns.  Lab Results  Component Value Date   CHOL 142 06/25/2022   HDL 67.00 06/25/2022   LDLCALC 49 06/25/2022   LDLDIRECT 79.0 06/10/2017   TRIG 130.0 06/25/2022   CHOLHDL 2 06/25/2022   Insulin Resistance: Continues to monitor his glucose regularly and notes that it tends to fluctuate.  Hx of Prostate Cancer Continues to have nocturia about 6/night.  Does not see urology and does not want to if not needed.  Health Maintenance: Immunizations -- none Colonoscopy -- Last done 07/19/20. 1 polyp removed, diverticulosis found in sigmoid colon, external and non-bleeding internal hemorrhoids found. Repeat 2029. PSA --  Lab Results  Component Value Date   PSA 0.00 (L) 06/25/2022   PSA 0.00 (L) 06/20/2021   PSA 0.04 06/18/2020   Diet -- Healthy overall: Notes many pastries and breads Sleep habits -- Nocturia about 6x nightly, otherwise good sleep quality Exercise -- Regular exercise: walking a mile for about 25 minutes daily and  calisthenics for 20 minutes.   Weight -- Recent weight history Wt Readings from Last 10 Encounters:  06/28/23 154 lb 8 oz (70.1 kg)   06/25/22 153 lb 12.8 oz (69.8 kg)  02/11/22 158 lb 8 oz (71.9 kg)  02/09/22 159 lb (72.1 kg)  09/22/21 165 lb 6.4 oz (75 kg)  06/20/21 162 lb 4 oz (73.6 kg)  01/31/21 163 lb 3.2 oz (74 kg)  12/25/20 164 lb (74.4 kg)  12/19/20 164 lb 9.6 oz (74.7 kg)  07/19/20 163 lb (73.9 kg)   Body mass index is 24.2 kg/m.  Mood -- Stable overall.  Alcohol use --  reports current alcohol use.  Tobacco use --  Tobacco Use: Low Risk  (06/28/2023)   Patient History    Smoking Tobacco Use: Never    Smokeless Tobacco Use: Never    Passive Exposure: Not on file    Eligible for Low Dose CT? no  UTD with eye doctor? Yes UTD with dentist? Yes Established/UTD with dermatology? - No, has not needed to     06/28/2023    8:09 AM  Depression screen PHQ 2/9  Decreased Interest 0  Down, Depressed, Hopeless 0  PHQ - 2 Score 0  Altered sleeping 3  Tired, decreased energy 1  Change in appetite 0  Feeling bad or failure about yourself  0  Trouble concentrating 0  Moving slowly or fidgety/restless 0  Suicidal thoughts 0  PHQ-9 Score 4  Difficult doing work/chores Not difficult at all    Other providers/specialists: Patient Care Team: Jarold Motto, [place] as PCP - General (Physician Assistant) Gastroenterology, Deboraha Sprang as Consulting Physician (Gastroenterology) Davina Poke as Consulting Physician (Optometry) Dentistry, [facility] as Consulting Physician (Dentistry)   PMHx,  SurgHx, SocialHx, Medications, and Allergies were reviewed in the Visit Navigator and updated as appropriate.   Past Medical History:  Diagnosis Date   Allergy 2005   Arthritis    Cancer [facility])    prostate 01-2015   Hyperlipidemia    Hypertension    no meds    Past Surgical History:  Procedure Laterality Date   COLONOSCOPY  2013, 07/2015   Dr.Schooler   fatty tissue removed chin     HERNIA REPAIR     MINOR EXCISION OF ORAL LESION     PROSTATE SURGERY     reomoved 01-2015   VASECTOMY  1977   Family  History  Problem Relation Age of Onset   Diabetes Mother    Hearing loss Mother    Heart disease Mother    Hypertension Mother    Obesity Mother    Stroke Father    Diabetes Maternal Uncle    Colon cancer Neg Hx    Colon polyps Neg Hx    Esophageal cancer Neg Hx    Rectal cancer Neg Hx    Stomach cancer Neg Hx    Social History   Tobacco Use   Smoking status: Never   Smokeless tobacco: Never  Vaping Use   Vaping status: Never Used  Substance Use Topics   Alcohol use: Yes    Comment: Rarely   Drug use: No   Review of Systems:   Review of Systems  Constitutional:  Negative for chills, fever, malaise/fatigue and weight loss.  HENT:  Negative for hearing loss, sinus pain and sore throat.   Respiratory:  Negative for cough and hemoptysis.   Cardiovascular:  Negative for chest pain, palpitations, leg swelling and PND.  Gastrointestinal:  Negative for abdominal pain, constipation, diarrhea, heartburn, nausea and vomiting.  Genitourinary:  Negative for dysuria, frequency and urgency.  Musculoskeletal:  Negative for back pain, myalgias and neck pain.  Skin:  Negative for itching and rash.  Neurological:  Negative for dizziness, tingling, seizures and headaches.  Endo/Heme/Allergies:  Negative for polydipsia.  Psychiatric/Behavioral:  Negative for depression. The patient is not nervous/anxious.        Objective:    Vitals:   06/28/23 0806  BP: (!) 160/100  Pulse: 89  Temp: 98 F (36.7 C)  SpO2: 98%    Body mass index is 24.2 kg/m.  General  Alert, cooperative, no distress, appears stated age  Head:  Normocephalic, without obvious abnormality, atraumatic  Eyes:  PERRL, conjunctiva/corneas clear, EOM's intact, fundi benign, both eyes       Ears:  Normal TM's and external ear canals, both ears  Nose: Nares normal, septum midline, mucosa normal, no drainage or sinus tenderness  Throat: Lips, mucosa, and tongue normal; teeth and gums normal  Neck: Supple,  symmetrical, trachea midline, no adenopathy;     thyroid:  No enlargement/tenderness/nodules; no carotid bruit or JVD  Back:   Symmetric, no curvature, ROM normal, no CVA tenderness  Lungs:   Clear to auscultation bilaterally, respirations unlabored  Chest wall:  No tenderness or deformity  Heart:  Regular rate and rhythm, S1 and S2 normal, no murmur, rub or gallop  Abdomen:   Soft, non-tender, bowel sounds active all four quadrants, no masses, no organomegaly  Extremities: Extremities normal, atraumatic, no cyanosis or edema  Prostate : Deferred   Skin: Skin color, texture, turgor normal, no rashes or lesions  Lymph nodes: Cervical, supraclavicular, and axillary nodes normal  Neurologic: CNII-XII grossly intact. Normal strength, sensation and  reflexes throughout   AssessmentPlan:   Routine physical examination Today patient counseled on age appropriate routine health concerns for screening and prevention, each reviewed and up to date or declined. Immunizations reviewed and up to date or declined. Labs ordered and reviewed. Risk factors for depression reviewed and negative. Hearing function and visual acuity are intact. ADLs screened and addressed as needed. Functional ability and level of safety reviewed and appropriate. Education, counseling and referrals performed based on assessed risks today. Patient provided with a copy of personalized plan for preventive services.  History of prostate cancer Update PSA and provide recommendations  Hypertension, unspecified type Above goal today No evidence of end-organ damage on my exam Recommend patient monitor home blood pressure at least a few times weekly Continue lisinopril 10 mg daily If home monitoring shows consistent elevation, or any symptom(s) develop, recommend reach out to [place] for further advice on next steps  Insulin resistance Update Hemoglobin A1c and provide recommendations  Hyperlipidemia, unspecified hyperlipidemia  type Update lipid panel and adjust pravastatin 20 mg daily as indicated   I,Emily Lagle,acting as a Neurosurgeon for [employer], PA.,have documented all relevant documentation on the behalf of Jarold Motto, PA,as directed by  Jarold Motto, PA while in the presence of Jarold Motto, [place].  I, Jarold Motto, [place], have reviewed all documentation for this visit. The documentation on 06/28/23 for the exam, diagnosis, procedures, and orders are all accurate and complete.  Jarold Motto, PA-C [facility]

## 2023-06-28 NOTE — Patient Instructions (Addendum)
It was great to see you!  Keep an eye on your blood pressure and let us know if remains elevated.  Please go to the lab for blood work.   Our office will call you with your results unless you have chosen to receive results via MyChart.  If your blood work is normal we will follow-up each year for physicals and as scheduled for chronic medical problems.  If anything is abnormal we will treat accordingly and get you in for a follow-up.  Take care,  Lelon Mast

## 2023-06-29 ENCOUNTER — Encounter: Payer: Self-pay | Admitting: Physician Assistant

## 2023-06-29 ENCOUNTER — Other Ambulatory Visit: Payer: Self-pay | Admitting: Physician Assistant

## 2023-06-29 DIAGNOSIS — C61 Malignant neoplasm of prostate: Secondary | ICD-10-CM

## 2023-07-05 ENCOUNTER — Ambulatory Visit (INDEPENDENT_AMBULATORY_CARE_PROVIDER_SITE_OTHER): Payer: Medicare Other

## 2023-07-05 VITALS — Wt 154.8 lb

## 2023-07-05 DIAGNOSIS — Z0000 Encounter for general adult medical examination without abnormal findings: Secondary | ICD-10-CM

## 2023-07-05 NOTE — Patient Instructions (Signed)
Kenneth Salazar , Thank you for taking time to come for your Medicare Wellness Visit. I appreciate your ongoing commitment to your health goals. Please review the following plan we discussed and let me know if I can assist you in the future.   Referrals/Orders/Follow-Ups/Clinician Recommendations: maintain health and activity   This is a list of the screening recommended for you and due dates:  Health Maintenance  Topic Date Due   COVID-19 Vaccine (8 - 2024-25 season) 06/27/2024*   Medicare Annual Wellness Visit  07/04/2024   Colon Cancer Screening  07/20/2027   DTaP/Tdap/Td vaccine (3 - Td or Tdap) 06/14/2028   Pneumonia Vaccine  Completed   Flu Shot  Completed   Hepatitis C Screening  Completed   Zoster (Shingles) Vaccine  Completed   HPV Vaccine  Aged Out  *Topic was postponed. The date shown is not the original due date.    Advanced directives: (In Chart) A copy of your advanced directives are scanned into your chart should your provider ever need it.  Next Medicare Annual Wellness Visit scheduled for next year: Yes

## 2023-07-05 NOTE — Progress Notes (Signed)
Subjective:   Kenneth Salazar is a 77 y.o. male who presents for Medicare Annual/Subsequent preventive examination.  Visit Complete: Virtual I connected with  Kenneth Salazar on 07/05/23 by a audio enabled telemedicine application and verified that I am speaking with the correct person using two identifiers.  Patient Location: Home  Provider Location: Office/Clinic  I discussed the limitations of evaluation and management by telemedicine. The patient expressed understanding and agreed to proceed.  Vital Signs: Because this visit was a virtual/telehealth visit, some criteria may be missing or patient reported. Any vitals not documented were not able to be obtained and vitals that have been documented are patient reported.  Patient Medicare AWV questionnaire was completed by the patient on 06/28/23; I have confirmed that all information answered by patient is correct and no changes since this date.  Cardiac Risk Factors include: advanced age (>31men, >42 women);dyslipidemia;male gender;hypertension     Objective:    Today's Vitals   07/05/23 1436  Weight: 154 lb 12.8 oz (70.2 kg)   Body mass index is 24.25 kg/m.     07/05/2023    2:38 PM 06/29/2022    2:23 PM 06/26/2021    2:34 PM 06/19/2019    1:48 PM 06/14/2018    1:14 PM 06/07/2017    9:04 AM 01/14/2016    1:47 PM  Advanced Directives  Does Patient Have a Medical Advance Directive? Yes Yes Yes Yes Yes Yes Yes  Type of Estate agent of [place];Living will Healthcare Power of [place];Living will Healthcare Power of [employer] of [place];Living will Healthcare Power of [place];Living will Healthcare Power of [place];Living will Healthcare Power of [place];Living will;Out of facility DNR (pink MOST or yellow form)  Does patient want to make changes to medical advance directive? No - Patient declined No - Patient declined  No - Patient declined No - Patient declined No - Patient declined No  - Patient declined  Copy of Healthcare Power of Attorney in Chart? Yes - validated most recent copy scanned in chart (See row information) Yes - validated most recent copy scanned in chart (See row information) Yes - validated most recent copy scanned in chart (See row information) Yes - validated most recent copy scanned in chart (See row information) Yes - validated most recent copy scanned in chart (See row information) Yes No - copy requested    Current Medications (verified) Outpatient Encounter Medications as of 07/05/2023  Medication Sig   aspirin EC 81 MG tablet Take 81 mg by mouth daily.    FLUZONE HIGH-DOSE 0.5 ML injection    ipratropium (ATROVENT) 0.06 % nasal spray USE 2 SPRAYS IN BOTH NOSTRILS 4  TIMES DAILY   lisinopril (ZESTRIL) 10 MG tablet TAKE 1 TABLET BY MOUTH DAILY   Multiple Vitamin (MULTIVITAMIN) tablet Take 1 tablet by mouth daily.    Multiple Vitamins-Minerals (PRESERVISION AREDS PO) Take by mouth.   pravastatin (PRAVACHOL) 20 MG tablet TAKE 1 TABLET BY MOUTH DAILY   Psyllium (METAMUCIL MULTIHEALTH FIBER PO)    SPIKEVAX syringe    No facility-administered encounter medications on file as of 07/05/2023.    Allergies (verified) Sulfa antibiotics; Antihistamines, chlorpheniramine-type; Antihistamines, diphenhydramine-type; Antihistamines, loratadine-type; and Percocet [oxycodone-acetaminophen]   History: Past Medical History:  Diagnosis Date   Allergy 2005   Arthritis    Cancer [facility])    prostate 01-2015   Hyperlipidemia    Hypertension    no meds   Past Surgical History:  Procedure Laterality Date   COLONOSCOPY  2013, 07/2015   Dr.Schooler   fatty tissue removed chin     HERNIA REPAIR     MINOR EXCISION OF ORAL LESION     PROSTATE SURGERY     reomoved 01-2015   VASECTOMY  1977   Family History  Problem Relation Age of Onset   Diabetes Mother    Hearing loss Mother    Heart disease Mother    Hypertension Mother    Obesity Mother    Stroke Father     Diabetes Maternal Uncle    Colon cancer Neg Hx    Colon polyps Neg Hx    Esophageal cancer Neg Hx    Rectal cancer Neg Hx    Stomach cancer Neg Hx    Social History   Socioeconomic History   Marital status: Married    Spouse name: Not on file   Number of children: Not on file   Years of education: Not on file   Highest education level: Not on file  Occupational History   Occupation: retired    Comment: maintenance via computer   Tobacco Use   Smoking status: Never   Smokeless tobacco: Never  Vaping Use   Vaping status: Never Used  Substance and Sexual Activity   Alcohol use: Yes    Comment: Rarely   Drug use: No   Sexual activity: Not Currently  Other Topics Concern   Not on file  Social History Narrative   Retired at age 71   Worked for telephone company   Married   3 children, 1 grandkid   Social Drivers of Corporate investment banker Strain: Low Risk  (06/28/2023)   Overall Financial Resource Strain (CARDIA)    Difficulty of Paying Living Expenses: Not hard at all  Food Insecurity: No Food Insecurity (06/28/2023)   Hunger Vital Sign    Worried About Running Out of Food in the Last Year: Never true    Ran Out of Food in the Last Year: Never true  Transportation Needs: Unmet Transportation Needs (06/28/2023)   PRAPARE - Transportation    Lack of Transportation (Medical): Yes    Lack of Transportation (Non-Medical): Yes  Physical Activity: Sufficiently Active (06/28/2023)   Exercise Vital Sign    Days of Exercise per Week: 7 days    Minutes of Exercise per Session: 30 min  Stress: No Stress Concern Present (06/28/2023)   [employer] of Occupational Health - Occupational Stress Questionnaire    Feeling of Stress : Not at all  Social Connections: Moderately Integrated (06/28/2023)   Social Connection and Isolation Panel [NHANES]    Frequency of Communication with Friends and Family: Never    Frequency of Social Gatherings with Friends and Family:  Never    Attends Religious Services: More than 4 times per year    Active Member of [employer] or Organizations: Yes    Attends Engineer, structural: More than 4 times per year    Marital Status: Married    Tobacco Counseling Counseling given: Not Answered   Clinical Intake:  Pre-visit preparation completed: Yes  Pain : No/denies pain     BMI - recorded: 24.25 Nutritional Status: BMI of 19-24  Normal Nutritional Risks: None  How often do you need to have someone help you when you read instructions, pamphlets, or other written materials from your doctor or pharmacy?: 1 - Never  Interpreter Needed?: No  Information entered by :: Lanier Ensign, LPN   Activities of Daily Living  06/28/2023    1:46 PM  In your present state of health, do you have any difficulty performing the following activities:  Hearing? 1  Comment pt has hearing aids  Vision? 0  Difficulty concentrating or making decisions? 1  Comment at times drift off from thought  Walking or climbing stairs? 0  Dressing or bathing? 0  Doing errands, shopping? 0  Preparing Food and eating ? N  Using the Toilet? N  In the past six months, have you accidently leaked urine? N  Do you have problems with loss of bowel control? N  Managing your Medications? N  Managing your Finances? N  Housekeeping or managing your Housekeeping? N    Patient Care Team: Jarold Motto, [place] as PCP - General (Physician Assistant) Gastroenterology, Deboraha Sprang as Consulting Physician (Gastroenterology) Davina Poke as Consulting Physician (Optometry) Dentistry, [facility] as Consulting Physician (Dentistry)  Indicate any recent Medical Services you may have received from other than [facility] providers in the past year (date may be approximate).     Assessment:   This is a routine wellness examination for Kenneth Salazar.  Hearing/Vision screen Hearing Screening - Comments:: Pt has hearing aids  Vision Screening - Comments::  Pt follows up with Dr Shea Evans for annual eye exams    Goals Addressed             This Visit's Progress    Patient Stated       Maintain health and activity        Depression Screen    07/05/2023    2:38 PM 06/28/2023    8:09 AM 06/29/2022    2:21 PM 06/25/2022    9:47 AM 06/26/2021    2:32 PM 06/20/2021    9:24 AM 12/19/2020    4:19 PM  PHQ 2/9 Scores  PHQ - 2 Score 0 0 0 0 0 1 0  PHQ- 9 Score 0 4    3     Fall Risk    06/28/2023    1:46 PM 06/28/2023    8:10 AM 06/29/2022    2:24 PM 06/25/2022    9:47 AM 06/22/2022    1:56 PM  Fall Risk   Falls in the past year? 0 0 0 0 0  Number falls in past yr: 0 0 0 0 0  Injury with Fall? 0 0 0 0 0  Risk for fall due to : No Fall Risks  Impaired vision No Fall Risks   Follow up Falls prevention discussed Falls evaluation completed Falls prevention discussed      MEDICARE RISK AT HOME: Medicare Risk at Home Any stairs in or around the home?: Yes If so, are there any without handrails?: No Home free of loose throw rugs in walkways, pet beds, electrical cords, etc?: Yes Adequate lighting in your home to reduce risk of falls?: Yes Life alert?: No Use of a cane, walker or w/c?: No Grab bars in the bathroom?: No Shower chair or bench in shower?: No Elevated toilet seat or a handicapped toilet?: Yes  TIMED UP AND GO:  Was the test performed?  No    Cognitive Function:    06/07/2017    9:14 AM  MMSE - Mini Mental State Exam  Orientation to time 5  Orientation to Place 5  Registration 3  Attention/ Calculation 5  Recall 3  Language- name 2 objects 2  Language- repeat 1  Language- follow 3 step command 3  Language- read & follow direction 1  Write  a sentence 1  Copy design 1  Total score 30        07/05/2023    2:40 PM 06/29/2022    2:25 PM 06/20/2020   11:47 AM 06/19/2019    1:49 PM  6CIT Screen  What Year? 0 points 0 points 0 points 0 points  What month? 0 points 0 points 0 points 0 points  What time? 0  points 0 points  0 points  Count back from 20 0 points 0 points 0 points 0 points  Months in reverse 0 points 0 points 0 points 0 points  Repeat phrase 0 points 0 points 2 points 2 points  Total Score 0 points 0 points  2 points    Immunizations Immunization History  Administered Date(s) Administered   Fluad Quad(high Dose 65+) 03/21/2019, 03/31/2021, 04/03/2022   Influenza, High Dose Seasonal PF 06/07/2017, 04/14/2018, 03/14/2020, 03/16/2023   Influenza-Unspecified 04/21/2016, 03/31/2021   Moderna Covid-19 Fall Seasonal Vaccine 87yrs & older 03/16/2023   Moderna SARS-COV2 Booster Vaccination 04/03/2022   PFIZER(Purple Top)SARS-COV-2 Vaccination 08/17/2019, 09/11/2019, 04/09/2020, 11/12/2020, 04/01/2021, 11/29/2021   Pneumococcal Conjugate-13 05/18/2014   Pneumococcal Polysaccharide-23 01/29/2009   Td 01/17/2008   Tdap 06/14/2018   Zoster Recombinant(Shingrix) 08/04/2018, 01/03/2019   Zoster, Live 01/17/2008    TDAP status: Up to date  Flu Vaccine status: Up to date  Pneumococcal vaccine status: Up to date  Covid-19 vaccine status: Information provided on how to obtain vaccines.   Qualifies for Shingles Vaccine? Yes   Zostavax completed Yes   Shingrix Completed?: Yes  Screening Tests Health Maintenance  Topic Date Due   COVID-19 Vaccine (8 - 2024-25 season) 06/27/2024 (Originally 05/11/2023)   Medicare Annual Wellness (AWV)  07/04/2024   Colonoscopy  07/20/2027   DTaP/Tdap/Td (3 - Td or Tdap) 06/14/2028   Pneumonia Vaccine 56+ Years old  Completed   INFLUENZA VACCINE  Completed   Hepatitis C Screening  Completed   Zoster Vaccines- Shingrix  Completed   HPV VACCINES  Aged Out    Health Maintenance  There are no preventive care reminders to display for this patient.   Colorectal cancer screening: Type of screening: Colonoscopy. Completed 07/19/20. Repeat every 7 years  Additional Screening:  Hepatitis C Screening:  Completed 06/10/17  Vision Screening:  Recommended annual ophthalmology exams for early detection of glaucoma and other disorders of the eye. Is the patient up to date with their annual eye exam?  Yes  Who is the provider or what is the name of the office in which the patient attends annual eye exams? Dr Shea Evans If pt is not established with a provider, would they like to be referred to a provider to establish care? No .   Dental Screening: Recommended annual dental exams for proper oral hygiene   Community Resource Referral / Chronic Care Management: CRR required this visit?  No   CCM required this visit?  No     Plan:     I have personally reviewed and noted the following in the patient's chart:   Medical and social history Use of alcohol, tobacco or illicit drugs  Current medications and supplements including opioid prescriptions. Patient is not currently taking opioid prescriptions. Functional ability and status Nutritional status Physical activity Advanced directives List of other physicians Hospitalizations, surgeries, and ER visits in previous 12 months Vitals Screenings to include cognitive, depression, and falls Referrals and appointments  In addition, I have reviewed and discussed with patient certain preventive protocols, quality metrics, and best practice recommendations.  A written personalized care plan for preventive services as well as general preventive health recommendations were provided to patient.     Marzella Schlein, LPN   [id number]   After Visit Summary: (MyChart) Due to this being a telephonic visit, the after visit summary with patients personalized plan was offered to patient via MyChart   Nurse Notes: none

## 2023-08-02 ENCOUNTER — Other Ambulatory Visit (INDEPENDENT_AMBULATORY_CARE_PROVIDER_SITE_OTHER): Payer: Medicare Other

## 2023-08-02 ENCOUNTER — Encounter: Payer: Self-pay | Admitting: Physician Assistant

## 2023-08-02 DIAGNOSIS — C61 Malignant neoplasm of prostate: Secondary | ICD-10-CM

## 2023-08-02 LAB — PSA: PSA: 0 ng/mL — ABNORMAL LOW (ref 0.10–4.00)

## 2023-08-05 ENCOUNTER — Encounter: Payer: Self-pay | Admitting: Physician Assistant

## 2023-08-10 ENCOUNTER — Encounter: Payer: Self-pay | Admitting: Physician Assistant

## 2023-08-10 ENCOUNTER — Ambulatory Visit (INDEPENDENT_AMBULATORY_CARE_PROVIDER_SITE_OTHER): Payer: Medicare Other | Admitting: Physician Assistant

## 2023-08-10 VITALS — BP 170/90 | HR 82 | Temp 98.1°F | Ht 67.0 in | Wt 159.5 lb

## 2023-08-10 DIAGNOSIS — R229 Localized swelling, mass and lump, unspecified: Secondary | ICD-10-CM | POA: Diagnosis not present

## 2023-08-10 NOTE — Progress Notes (Signed)
Kenneth Salazar is a 78 y.o. male here for a new problem.  History of Present Illness:   Chief Complaint  Patient presents with   Thyroid Nodule    Pt c/o nodule left side of neck, found during House Call.    HPI  Neck nodule He reports noticing a nodule located on the left side of his neck.  Nodule was detected on a house call last week.  He denies feeling anything. No recent flu symptoms other than a runny nose, no recent dental work, unintentional weight loss.    Past Medical History:  Diagnosis Date   Allergy 2005   Arthritis    Cancer [facility])    prostate 01-2015   Hyperlipidemia    Hypertension    no meds     Social History   Tobacco Use   Smoking status: Never   Smokeless tobacco: Never  Vaping Use   Vaping status: Never Used  Substance Use Topics   Alcohol use: Yes    Comment: Rarely   Drug use: No    Past Surgical History:  Procedure Laterality Date   COLONOSCOPY  2013, 07/2015   Dr.Schooler   fatty tissue removed chin     HERNIA REPAIR     MINOR EXCISION OF ORAL LESION     PROSTATE SURGERY     reomoved 01-2015   VASECTOMY  1977    Family History  Problem Relation Age of Onset   Diabetes Mother    Hearing loss Mother    Heart disease Mother    Hypertension Mother    Obesity Mother    Stroke Father    Diabetes Maternal Uncle    Colon cancer Neg Hx    Colon polyps Neg Hx    Esophageal cancer Neg Hx    Rectal cancer Neg Hx    Stomach cancer Neg Hx     Allergies  Allergen Reactions   Sulfa Antibiotics Hives   Antihistamines, Chlorpheniramine-Type Palpitations   Antihistamines, Diphenhydramine-Type Palpitations   Antihistamines, Loratadine-Type Palpitations   Percocet [Oxycodone-Acetaminophen] Palpitations    Current Medications:   Current Outpatient Medications:    aspirin EC 81 MG tablet, Take 81 mg by mouth daily. , Disp: , Rfl:    ipratropium (ATROVENT) 0.06 % nasal spray, USE 2 SPRAYS IN BOTH NOSTRILS 4  TIMES DAILY, Disp: 150 mL,  Rfl: 1   lisinopril (ZESTRIL) 10 MG tablet, TAKE 1 TABLET BY MOUTH DAILY, Disp: 100 tablet, Rfl: 2   Multiple Vitamin (MULTIVITAMIN) tablet, Take 1 tablet by mouth daily. , Disp: , Rfl:    Multiple Vitamins-Minerals (PRESERVISION AREDS PO), Take by mouth., Disp: , Rfl:    pravastatin (PRAVACHOL) 20 MG tablet, TAKE 1 TABLET BY MOUTH DAILY, Disp: 100 tablet, Rfl: 2   Psyllium (METAMUCIL MULTIHEALTH FIBER PO), , Disp: , Rfl:    Review of Systems:   Review of Systems  HENT:         +runny nose    Vitals:   Vitals:   08/10/23 1014  BP: (!) 170/90  Pulse: 82  Temp: 98.1 F (36.7 C)  TempSrc: Temporal  SpO2: 97%  Weight: 159 lb 8 oz (72.3 kg)  Height: 5\' 7"  (1.702 m)     Body mass index is 24.98 kg/m.  Physical Exam:   Physical Exam Vitals and nursing note reviewed.  Constitutional:      General: He is not in acute distress.    Appearance: He is well-developed. He is not ill-appearing or toxic-appearing.  Cardiovascular:     Rate and Rhythm: Normal rate and regular rhythm.     Pulses: Normal pulses.     Heart sounds: Normal heart sounds, S1 normal and S2 normal.  Pulmonary:     Effort: Pulmonary effort is normal.     Breath sounds: Normal breath sounds.  Lymphadenopathy:     Head:     Right side of head: No submental, submandibular or tonsillar adenopathy.     Left side of head: No submental, submandibular or tonsillar adenopathy.  Skin:    General: Skin is warm and dry.  Neurological:     Mental Status: He is alert.     GCS: GCS eye subscore is 4. GCS verbal subscore is 5. GCS motor subscore is 6.  Psychiatric:        Speech: Speech normal.        Behavior: Behavior normal. Behavior is cooperative.     Assessment and Plan:   Localized skin mass, lump, or swelling No findings on my exam We did discuss getting ultrasound for further evaluation but given the fact that he is not having any symptom(s) and we cannot find any abnormalities on exam today, we decided  to wait on symptom(s) and he will reach out if there is any new symptom(s)    Jarold Motto, PA-C  Ladona Mow M Kadhim,acting as a scribe for Jarold Motto, PA.,have documented all relevant documentation on the behalf of Jarold Motto, PA,as directed by  Jarold Motto, PA while in the presence of Jarold Motto, [place].   I, Jarold Motto, [place], have reviewed all documentation for this visit. The documentation on 08/10/23 for the exam, diagnosis, procedures, and orders are all accurate and complete.

## 2023-08-12 ENCOUNTER — Other Ambulatory Visit: Payer: Self-pay | Admitting: Physician Assistant

## 2023-09-06 ENCOUNTER — Encounter: Payer: Self-pay | Admitting: Physician Assistant

## 2023-11-02 ENCOUNTER — Ambulatory Visit: Admitting: Physician Assistant

## 2023-11-02 ENCOUNTER — Encounter: Payer: Self-pay | Admitting: Physician Assistant

## 2023-11-02 VITALS — BP 156/84 | HR 89 | Temp 98.6°F | Ht 67.0 in | Wt 159.0 lb

## 2023-11-02 DIAGNOSIS — E88819 Insulin resistance, unspecified: Secondary | ICD-10-CM | POA: Diagnosis not present

## 2023-11-02 DIAGNOSIS — D492 Neoplasm of unspecified behavior of bone, soft tissue, and skin: Secondary | ICD-10-CM | POA: Diagnosis not present

## 2023-11-02 DIAGNOSIS — H532 Diplopia: Secondary | ICD-10-CM

## 2023-11-02 DIAGNOSIS — E785 Hyperlipidemia, unspecified: Secondary | ICD-10-CM

## 2023-11-02 DIAGNOSIS — I10 Essential (primary) hypertension: Secondary | ICD-10-CM

## 2023-11-02 DIAGNOSIS — R55 Syncope and collapse: Secondary | ICD-10-CM

## 2023-11-02 DIAGNOSIS — G4452 New daily persistent headache (NDPH): Secondary | ICD-10-CM | POA: Diagnosis not present

## 2023-11-02 LAB — CBC WITH DIFFERENTIAL/PLATELET
Basophils Absolute: 0 10*3/uL (ref 0.0–0.1)
Basophils Relative: 0.4 % (ref 0.0–3.0)
Eosinophils Absolute: 0.1 10*3/uL (ref 0.0–0.7)
Eosinophils Relative: 1 % (ref 0.0–5.0)
HCT: 43.9 % (ref 39.0–52.0)
Hemoglobin: 14.8 g/dL (ref 13.0–17.0)
Lymphocytes Relative: 17.6 % (ref 12.0–46.0)
Lymphs Abs: 1 10*3/uL (ref 0.7–4.0)
MCHC: 33.8 g/dL (ref 30.0–36.0)
MCV: 92.3 fl (ref 78.0–100.0)
Monocytes Absolute: 0.6 10*3/uL (ref 0.1–1.0)
Monocytes Relative: 10.3 % (ref 3.0–12.0)
Neutro Abs: 3.9 10*3/uL (ref 1.4–7.7)
Neutrophils Relative %: 70.7 % (ref 43.0–77.0)
Platelets: 161 10*3/uL (ref 150.0–400.0)
RBC: 4.75 Mil/uL (ref 4.22–5.81)
RDW: 13.3 % (ref 11.5–15.5)
WBC: 5.6 10*3/uL (ref 4.0–10.5)

## 2023-11-02 LAB — COMPREHENSIVE METABOLIC PANEL WITH GFR
ALT: 26 U/L (ref 0–53)
AST: 18 U/L (ref 0–37)
Albumin: 4.4 g/dL (ref 3.5–5.2)
Alkaline Phosphatase: 61 U/L (ref 39–117)
BUN: 13 mg/dL (ref 6–23)
CO2: 31 meq/L (ref 19–32)
Calcium: 9.1 mg/dL (ref 8.4–10.5)
Chloride: 101 meq/L (ref 96–112)
Creatinine, Ser: 1.16 mg/dL (ref 0.40–1.50)
GFR: 59.57 mL/min — ABNORMAL LOW (ref >60.00)
Glucose, Bld: 104 mg/dL — ABNORMAL HIGH (ref 70–99)
Potassium: 3.9 meq/L (ref 3.5–5.1)
Sodium: 139 meq/L (ref 135–145)
Total Bilirubin: 0.8 mg/dL (ref 0.2–1.2)
Total Protein: 6.7 g/dL (ref 6.0–8.3)

## 2023-11-02 LAB — TSH: TSH: 1.83 u[IU]/mL (ref 0.35–5.50)

## 2023-11-02 LAB — LIPID PANEL
Cholesterol: 145 mg/dL (ref 0–200)
HDL: 59.9 mg/dL (ref >39.00)
LDL Cholesterol: 38 mg/dL (ref 0–99)
NonHDL: 85.14
Total CHOL/HDL Ratio: 2
Triglycerides: 236 mg/dL — ABNORMAL HIGH (ref 0.0–149.0)
VLDL: 47.2 mg/dL — ABNORMAL HIGH (ref 0.0–40.0)

## 2023-11-02 LAB — HEMOGLOBIN A1C: Hgb A1c MFr Bld: 6.4 % (ref 4.6–6.5)

## 2023-11-02 NOTE — Progress Notes (Signed)
 Kenneth Salazar is a 78 y.o. male here for a new problem.  History of Present Illness:   Chief Complaint  Patient presents with   Headache    Pt c/o pain right side of head and radiates around top of head. Also lumps on top of head.   Pt is accompanied by his wife.   Headaches / Pre-syncopal episodes:  Pt complains of right-sided headaches and occasional left-sided headaches.  He typically feels pain on the right posterior side of his head, radiating to his right temple.  Describes pain from his headaches as having "a band that is too tight" on his head.  Rates his pain a 1-2/10.  He has taken Tylenol and Advil, with no relief.  He monitors his BP at home; reports 130/78 this morning.   His wife reports a pre-syncopal episode on Sunday 4/20 after eating.  He drank a cup of coffee and water for his medication.  At the time he showed no physical signs of pre-syncope such as pale skin, diaphoresis, or dyspnea.  His pre-syncopal symptoms resolved after putting his head down.  He is UTD with [facility] of optometry.  If he touches a certain part of the back of his head he can see double. Denies any pain with eye movement, but endorses some left eye soreness.  No recent falls.   Lump on his head: He reports having a lump on the top of his head. He notes the lump has decreased in size since he first noticed it.  Pt has not seen dermatology for several years.  He tends to wear hats often.  Denies any pain.   HTN Currently taking lisinopril  10 mg. At home blood pressure readings are: normal. Patient denies chest pain, SOB, blurred vision, dizziness, unusual headaches, lower leg swelling. Patient is compliant with medication. Denies excessive caffeine intake, stimulant usage, excessive alcohol intake, or increase in salt consumption.  BP Readings from Last 3 Encounters:  11/02/23 (!) 156/84  08/10/23 (!) 170/90  06/28/23 (!) 162/98      Past Medical History:  Diagnosis Date    Allergy 2005   Arthritis    Cancer [facility])    prostate 01-2015   Hyperlipidemia    Hypertension    no meds     Social History   Tobacco Use   Smoking status: Never   Smokeless tobacco: Never  Vaping Use   Vaping status: Never Used  Substance Use Topics   Alcohol use: Yes    Comment: Rarely   Drug use: No    Past Surgical History:  Procedure Laterality Date   COLONOSCOPY  2013, 07/2015   Dr.Schooler   fatty tissue removed chin     HERNIA REPAIR     MINOR EXCISION OF ORAL LESION     PROSTATE SURGERY     reomoved 01-2015   VASECTOMY  1977    Family History  Problem Relation Age of Onset   Diabetes Mother    Hearing loss Mother    Heart disease Mother    Hypertension Mother    Obesity Mother    Stroke Father    Diabetes Maternal Uncle    Colon cancer Neg Hx    Colon polyps Neg Hx    Esophageal cancer Neg Hx    Rectal cancer Neg Hx    Stomach cancer Neg Hx     Allergies  Allergen Reactions   Sulfa Antibiotics Hives   Antihistamines, Chlorpheniramine-Type Palpitations   Antihistamines, Diphenhydramine-Type Palpitations  Antihistamines, Loratadine-Type Palpitations   Percocet [Oxycodone-Acetaminophen] Palpitations    Current Medications:   Current Outpatient Medications:    aspirin EC 81 MG tablet, Take 81 mg by mouth daily. , Disp: , Rfl:    ipratropium (ATROVENT ) 0.06 % nasal spray, USE 2 SPRAYS IN BOTH NOSTRILS 4  TIMES DAILY, Disp: 150 mL, Rfl: 1   lisinopril  (ZESTRIL ) 10 MG tablet, TAKE 1 TABLET BY MOUTH DAILY, Disp: 100 tablet, Rfl: 2   Multiple Vitamin (MULTIVITAMIN) tablet, Take 1 tablet by mouth daily. , Disp: , Rfl:    Multiple Vitamins-Minerals (PRESERVISION AREDS PO), Take by mouth., Disp: , Rfl:    pravastatin  (PRAVACHOL ) 20 MG tablet, TAKE 1 TABLET BY MOUTH DAILY, Disp: 100 tablet, Rfl: 2   Psyllium (METAMUCIL MULTIHEALTH FIBER PO), , Disp: , Rfl:    Review of Systems:   Negative unless otherwise specified per HPI.  Vitals:   Vitals:    11/02/23 1131 11/02/23 1224  BP: (!) 160/86 (!) 156/84  Pulse: 89   Temp: 98.6 F (37 C)   TempSrc: Temporal   SpO2: 96%   Weight: 159 lb (72.1 kg)   Height: 5\' 7"  (1.702 m)      Body mass index is 24.9 kg/m.  Physical Exam:   Physical Exam Vitals and nursing note reviewed.  Constitutional:      General: He is not in acute distress.    Appearance: He is well-developed. He is not ill-appearing or toxic-appearing.  HENT:     Head: Normocephalic.  Eyes:     Conjunctiva/sclera: Conjunctivae normal.     Pupils: Pupils are equal, round, and reactive to light.  Cardiovascular:     Rate and Rhythm: Normal rate and regular rhythm.     Pulses: Normal pulses.     Heart sounds: Normal heart sounds, S1 normal and S2 normal.  Pulmonary:     Effort: Pulmonary effort is normal.     Breath sounds: Normal breath sounds.  Musculoskeletal:        General: Normal range of motion.     Cervical back: Normal range of motion.  Skin:    General: Skin is warm and dry.     Comments: [name] raised flat papule to top of head Erythematous scaly papule to top of head  Neurological:     General: No focal deficit present.     Mental Status: He is alert and oriented to person, place, and time.     GCS: GCS eye subscore is 4. GCS verbal subscore is 5. GCS motor subscore is 6.     Cranial Nerves: Cranial nerves 2-12 are intact.     Sensory: Sensation is intact.     Motor: Motor function is intact.     Coordination: Coordination is intact.     Gait: Gait is intact.  Psychiatric:        Speech: Speech normal.        Behavior: Behavior normal. Behavior is cooperative.        Thought Content: Thought content normal.        Judgment: Judgment normal.     Assessment and Plan:   Hypertension, unspecified type Above goal today No evidence of end-organ damage on my exam Recommend patient monitor home blood pressure at least a few times weekly Continue lisinopril  10 mg daily If home monitoring shows  consistent elevation, or any symptom(s) develop, recommend reach out to us  for further advice on next steps  Hyperlipidemia, unspecified hyperlipidemia type Update lipid panel  to ensure pravastatin  20 mg is at therapeutic dosage  Insulin resistance Update Hemoglobin A1c and provide recommendations   Abnormal skin growth Refer to dermatology   New daily persistent headache; Double vision Neurology exam normal Will order MRI and MRA (angiogram) for further evaluation Update blood work If new/worsening symptom(s), needs to go to the ER   Pre-syncope EKG tracing is personally reviewed.  EKG notes NSR.  No acute changes.  Referral to cardiology Will order MRI/MRA (angiogram) today He insists blood pressure is normal at home - continue to monitor Update blood work If worsening symptom(s) or true syncope, recommend ER     I, Bernita Bristle, acting as a Neurosurgeon for Alexander Iba, [place]., have documented all relevant documentation on the behalf of Alexander Iba, [place], as directed by  Alexander Iba, PA while in the presence of Alexander Iba, [place].  I, Alexander Iba, [place], have reviewed all documentation for this visit. The documentation on 11/02/23 for the exam, diagnosis, procedures, and orders are all accurate and complete.  I spent a total of 78 minutes on this visit, today 11/02/23, which included reviewing previous notes from prior imaging studies, ordering tests, discussing plan of care with patient and using shared-decision making on next steps, refilling medications, and documenting the findings in the note.   Alexander Iba, PA-C

## 2023-11-02 NOTE — Patient Instructions (Signed)
 It was great to see you!  We are going to get an EKG, MRI Referral to cardiology and dermatology  If any new/worsening symptom(s), please go to the ER  Take care,  Jorie Zee PA-C

## 2023-11-03 ENCOUNTER — Encounter: Payer: Self-pay | Admitting: Physician Assistant

## 2023-11-03 ENCOUNTER — Other Ambulatory Visit: Payer: Self-pay | Admitting: Physician Assistant

## 2023-11-03 ENCOUNTER — Ambulatory Visit (HOSPITAL_COMMUNITY)
Admission: RE | Admit: 2023-11-03 | Discharge: 2023-11-03 | Disposition: A | Discharge status: Home / Self Care | Source: Ambulatory Visit | Attending: Physician Assistant | Admitting: Physician Assistant

## 2023-11-03 DIAGNOSIS — R55 Syncope and collapse: Secondary | ICD-10-CM | POA: Diagnosis not present

## 2023-11-03 DIAGNOSIS — G4452 New daily persistent headache (NDPH): Secondary | ICD-10-CM | POA: Insufficient documentation

## 2023-11-03 DIAGNOSIS — H532 Diplopia: Secondary | ICD-10-CM | POA: Insufficient documentation

## 2023-11-03 DIAGNOSIS — R944 Abnormal results of kidney function studies: Secondary | ICD-10-CM

## 2023-11-03 DIAGNOSIS — R519 Headache, unspecified: Secondary | ICD-10-CM | POA: Diagnosis not present

## 2023-11-03 DIAGNOSIS — R9082 White matter disease, unspecified: Secondary | ICD-10-CM | POA: Diagnosis not present

## 2023-11-04 ENCOUNTER — Encounter: Payer: Self-pay | Admitting: Physician Assistant

## 2023-11-16 ENCOUNTER — Encounter: Payer: Self-pay | Admitting: Physician Assistant

## 2023-11-16 ENCOUNTER — Other Ambulatory Visit (INDEPENDENT_AMBULATORY_CARE_PROVIDER_SITE_OTHER)

## 2023-11-16 DIAGNOSIS — R944 Abnormal results of kidney function studies: Secondary | ICD-10-CM

## 2023-11-16 LAB — COMPREHENSIVE METABOLIC PANEL WITH GFR
ALT: 25 U/L (ref 0–53)
AST: 18 U/L (ref 0–37)
Albumin: 4.3 g/dL (ref 3.5–5.2)
Alkaline Phosphatase: 65 U/L (ref 39–117)
BUN: 16 mg/dL (ref 6–23)
CO2: 31 meq/L (ref 19–32)
Calcium: 9.1 mg/dL (ref 8.4–10.5)
Chloride: 100 meq/L (ref 96–112)
Creatinine, Ser: 1.09 mg/dL (ref 0.40–1.50)
GFR: 64.18 mL/min (ref >60.00)
Glucose, Bld: 142 mg/dL — ABNORMAL HIGH (ref 70–99)
Potassium: 3.8 meq/L (ref 3.5–5.1)
Sodium: 138 meq/L (ref 135–145)
Total Bilirubin: 0.8 mg/dL (ref 0.2–1.2)
Total Protein: 6.6 g/dL (ref 6.0–8.3)

## 2023-11-24 DIAGNOSIS — L570 Actinic keratosis: Secondary | ICD-10-CM | POA: Diagnosis not present

## 2023-11-24 DIAGNOSIS — L821 Other seborrheic keratosis: Secondary | ICD-10-CM | POA: Diagnosis not present

## 2023-11-24 DIAGNOSIS — D225 Melanocytic nevi of trunk: Secondary | ICD-10-CM | POA: Diagnosis not present

## 2023-11-24 DIAGNOSIS — D1801 Hemangioma of skin and subcutaneous tissue: Secondary | ICD-10-CM | POA: Diagnosis not present

## 2023-11-24 DIAGNOSIS — D224 Melanocytic nevi of scalp and neck: Secondary | ICD-10-CM | POA: Diagnosis not present

## 2023-12-02 DIAGNOSIS — H353132 Nonexudative age-related macular degeneration, bilateral, intermediate dry stage: Secondary | ICD-10-CM | POA: Diagnosis not present

## 2024-01-06 ENCOUNTER — Encounter: Payer: Self-pay | Admitting: Physician Assistant

## 2024-01-20 ENCOUNTER — Other Ambulatory Visit: Payer: Self-pay | Admitting: Physician Assistant

## 2024-03-20 ENCOUNTER — Encounter: Payer: Self-pay | Admitting: Cardiovascular Disease

## 2024-03-20 ENCOUNTER — Ambulatory Visit: Attending: Cardiovascular Disease | Admitting: Cardiovascular Disease

## 2024-03-20 VITALS — BP 160/82 | HR 88 | Ht 67.0 in | Wt 158.6 lb

## 2024-03-20 DIAGNOSIS — R55 Syncope and collapse: Secondary | ICD-10-CM

## 2024-03-20 DIAGNOSIS — E782 Mixed hyperlipidemia: Secondary | ICD-10-CM | POA: Diagnosis not present

## 2024-03-20 DIAGNOSIS — I10 Essential (primary) hypertension: Secondary | ICD-10-CM | POA: Diagnosis not present

## 2024-03-20 NOTE — Patient Instructions (Signed)
 Medication Instructions:  No medication changes were made at this visit. Continue current regimen.   *If you need a refill on your cardiac medications before your next appointment, please call your pharmacy*  Lab Work: None ordered today. If you have labs (blood work) drawn today and your tests are completely normal, you will receive your results only by: MyChart Message (if you have MyChart) OR A paper copy in the mail If you have any lab test that is abnormal or we need to change your treatment, we will call you to review the results.  Testing/Procedures: Your physician has requested that you have an echocardiogram. Echocardiography is a painless test that uses sound waves to create images of your heart. It provides your doctor with information about the size and shape of your heart and how well your heart's chambers and valves are working. This procedure takes approximately one hour. There are no restrictions for this procedure. Please do NOT wear cologne, perfume, aftershave, or lotions (deodorant is allowed). Please arrive 15 minutes prior to your appointment time.  Please note: We ask at that you not bring children with you during ultrasound (echo/ vascular) testing. Due to room size and safety concerns, children are not allowed in the ultrasound rooms during exams. Our front office staff cannot provide observation of children in our lobby area while testing is being conducted. An adult accompanying a patient to their appointment will only be allowed in the ultrasound room at the discretion of the ultrasound technician under special circumstances. We apologize for any inconvenience.   Follow-Up: At [facility], you and your health needs are our priority.  As part of our continuing mission to provide you with exceptional heart care, our providers are all part of one team.  This team includes your primary Cardiologist (physician) and Advanced Practice Providers or APPs (Physician  Assistants and Nurse Practitioners) who all work together to provide you with the care you need, when you need it.  Your next appointment:   As needed  Provider:   Ozell Fell, MD    We recommend signing up for the patient portal called MyChart.  Sign up information is provided on this After Visit Summary.  MyChart is used to connect with patients for Virtual Visits (Telemedicine).  Patients are able to view lab/test results, encounter notes, upcoming appointments, etc.  Non-urgent messages can be sent to your provider as well.   To learn more about what you can do with MyChart, go to [email or web address].   Other Instructions       Introducing KardiaBand, the new wearable EKG by [employer]. Clemente replaces your original Apple Watch band. The first of its kind, FDA-cleared KardiaBand provides accurate and instant analysis for detecting atrial fibrillation (AF) and normal sinus rhythm in an EKG. Simply place your thumb on the integrated KardiaBand sensor to take a medical-grade EKG in just 30 seconds. Results appear instantly on your Apple Watch. Clemente is available today for just $199. KardiaBand features are designed exclusively for use with Kardia Premium membership - $99 year. The Kardia Premium app for [employer] includes [employer]'s revolutionary SmartRhythm monitoring feature. SmartRhythm monitoring uses an intelligent neural network that runs directly on the [employer], constantly acquiring data from the watch's heart rate sensor and its accelerometer. SmartRhythm compares your heart rate to what it expects from your minute-by-minute level of activity. When the network sees a pattern of heart rate and activity that it does not expect, it notifies you to take an EKG.  With [employer], peace of mind is just an EKG away.  SABRA [email or web address]        FDA-cleared, clinical grade mobile EKG monitor: Crist is the most  clinically-validated mobile EKG used by the world's leading cardiac care medical professionals With Basic service, know instantly if your heart rhythm is normal or if atrial fibrillation is detected, and email the last single EKG recording to yourself or your doctor Premium service, available for purchase through the Kardia app for $9.99 per month or $99 per year, includes unlimited history and storage of your EKG recordings, a monthly EKG summary report to share with your doctor, along with the ability to track your blood pressure, activity and weight Includes one KardiaMobile phone clip FREE SHIPPING: Standard delivery 1-3 business days. Orders placed by 11:00am PST will ship that afternoon. Otherwise, will ship next business day. All orders ship via [employer] from [place], [place]    Step One- Record your EKG strip on [employer].   Step two- On Kardia EKG click "Download"   Step three- It will prompt you to make a password for this EKG. Please make the password "[email or web address]" so that we can view it.   Step four- Click on the little "upload" button (small box with an arrow in the middle) in the bottom left-hand corner of the screen.   Step five- Click "Save to Files"  Step six- Click on "On my iphone" and then "Pages" then press save in the top right-hand corner.   NOW GO TO MYCHART   Once on MyChart click "Messages"  Step one- Click "Send a message"  Step two- Click "Ask a medical question"   Step three- Click "Non urgent medical question"   Step four- Click on [facility] name.  Step five- Click on the small paperclip at the bottom of the screen  Step six- Click "Choose file"  Step seven- Pick the most recent EKG strip listed.   Once uploaded send the message!

## 2024-03-20 NOTE — Progress Notes (Signed)
 Cardiology Office Note:    Date:  03/20/2024   ID:  Toribio Pouch, DOB 02-14-1946, MRN [id number]  PCP:  Job Lukes, PA   [facility] Providers Cardiologist:  Ozell Fell, MD     Referring MD: Job Lukes, [place]   Chief Complaint  Patient presents with   Shortness of Breath    History of Present Illness:    Kenneth Salazar is a 78 y.o. male presenting for evaluation of near syncope and shortness of breath.  The patient is here with his wife today.  He is pretty active and he has no symptoms with his normal activities.  He reports a few episodes when he was sitting down and resting where he felt like he was sinking.  They were transient in nature and he did not completely lose consciousness either time this occurred.  He has not had any falls or episodes of frank syncope.  He does complain of heart palpitations and at times he feels like he cannot take a breath.  He reports exertional dyspnea with walking uphill.  He denies any chest pain, chest pressure, or heaviness in the chest.  He has otherwise been healthy other than taking medications for hypertension and hyperlipidemia with good control of both problems.  He had a coronary calcium  score performed in 2023 when his score was 14 placing him at the 90th percentile for age and gender.  He walks regularly for exercise and still does 40 push-ups every day. EKG 11/02/2023 shows normal sinus rhythm and is within normal limits.  Current Medications: Current Meds  Medication Sig   aspirin EC 81 MG tablet Take 81 mg by mouth daily.    ipratropium (ATROVENT ) 0.06 % nasal spray USE 2 SPRAYS IN BOTH NOSTRILS 4  TIMES DAILY   lisinopril  (ZESTRIL ) 10 MG tablet TAKE 1 TABLET BY MOUTH DAILY   Multiple Vitamin (MULTIVITAMIN) tablet Take 1 tablet by mouth daily.    Multiple Vitamins-Minerals (PRESERVISION AREDS PO) Take by mouth.   pravastatin  (PRAVACHOL ) 20 MG tablet TAKE 1 TABLET BY MOUTH DAILY   Psyllium (METAMUCIL  MULTIHEALTH FIBER PO)      Allergies:   Sulfa antibiotics; Antihistamines, chlorpheniramine-type; Antihistamines, diphenhydramine-type; Antihistamines, loratadine-type; and Percocet [oxycodone-acetaminophen]   Past Medical History:  Diagnosis Date   Allergy 2005   Arthritis    Cancer [facility])    prostate 01-2015   Hyperlipidemia    Hypertension    no meds   Past Surgical History:  Procedure Laterality Date   COLONOSCOPY  2013, 07/2015   Dr.Schooler   fatty tissue removed chin     HERNIA REPAIR     MINOR EXCISION OF ORAL LESION     PROSTATE SURGERY     reomoved 01-2015   VASECTOMY  1977    ROS:   Please see the history of present illness.    Positive for palpitations. All other systems reviewed and are negative.  EKGs/Labs/Other Studies Reviewed:    The following studies were reviewed today: Cardiac Studies & Procedures   ______________________________________________________________________________________________        SHERRILEE  LONG TERM MONITOR (3-14 DAYS) 03/02/2022  Narrative   Predominantly sinus rhythm.   12 episodes of nonsustained SVT were observed.  These were very brief and not significant.   Rare premature ventricular contractions, rare ventricular couplets   Rare premature atrial contractions   Patch Wear Time:  6 days and 10 hours (2023-08-06T07:34:46-0400 to 2023-08-12T18:04:13-0400)  Patient had a min HR of 56 bpm, max HR of  176 bpm, and avg HR of 83 bpm. Predominant underlying rhythm was Sinus Rhythm. 12 Supraventricular Tachycardia runs occurred, the run with the fastest interval lasting 4 beats with a max rate of 176 bpm, the longest lasting 10 beats with an avg rate of 136 bpm. Isolated SVEs were rare (<1.0%), SVE Couplets were rare (<1.0%), and SVE Triplets were rare (<1.0%). Isolated VEs were rare (<1.0%), VE Couplets were rare (<1.0%), and no VE Triplets were present. Ventricular Bigeminy and Trigeminy were present.   CT SCANS  CT CARDIAC  SCORING (SELF PAY ONLY) 03/10/2022  Addendum 03/10/2022  1:36 PM ADDENDUM REPORT: 03/10/2022 13:34  EXAM: OVER-READ INTERPRETATION  CT CHEST  The following report is an over-read performed by radiologist Dr. Oneil Fortis [facility] on 03/10/2022. This over-read does not include interpretation of cardiac or coronary anatomy or pathology. The coronary calcium  score interpretation by the cardiologist is attached.  COMPARISON:  None.  FINDINGS: Aorta normal caliber. No pericardial effusion. Esophagus unremarkable. No adenopathy. Visualized upper abdomen unremarkable. Lungs clear. Calcified granuloma RIGHT lower lobe. Remaining lungs clear. No acute osseous findings.  IMPRESSION: No significant extracardiac abnormalities.   Electronically Signed By: Oneil Kiss M.D. On: 03/10/2022 13:34  Narrative CLINICAL DATA:  Cardiovascular Disease Risk stratification  EXAM: Coronary Calcium  Score  TECHNIQUE: A gated, non-contrast computed tomography scan of the heart was performed using 3mm slice thickness. Axial images were analyzed on a dedicated workstation. Calcium  scoring of the coronary arteries was performed using the Agatston method.  FINDINGS: Coronary Calcium  Score:  Left main: 0  Left anterior descending artery: 0  Left circumflex artery: 14.3  Right coronary artery: 0  Total: 14.3  Percentile: 9  Pericardium: Normal.  Ascending Aorta: Normal caliber; mild aortic atherosclerosis.  Non-cardiac: See separate report from [facility].  IMPRESSION: Coronary calcium  score of 14.3. This was 9 percentile for age-, race-, and sex-matched controls.  Aortic atherosclerosis.  RECOMMENDATIONS: Coronary artery calcium  (CAC) score is a strong predictor of incident coronary heart disease (CHD) and provides predictive information beyond traditional risk factors. CAC scoring is reasonable to use in the decision to withhold, postpone, or  initiate statin therapy in intermediate-risk or selected borderline-risk asymptomatic adults (age 48-75 years and LDL-C >=70 to <190 mg/dL) who do not have diabetes or established atherosclerotic cardiovascular disease (ASCVD).* In intermediate-risk (10-year ASCVD risk >=7.5% to <20%) adults or selected borderline-risk (10-year ASCVD risk >=5% to <7.5%) adults in whom a CAC score is measured for the purpose of making a treatment decision the following recommendations have been made:  If CAC=0, it is reasonable to withhold statin therapy and reassess in 5 to 10 years, as long as higher risk conditions are absent (diabetes mellitus, family history of premature CHD in first degree relatives (males <55 years; females <65 years), cigarette smoking, or LDL >=190 mg/dL).  If CAC is 1 to 99, it is reasonable to initiate statin therapy for patients >=95 years of age.  If CAC is >=100 or >=75th percentile, it is reasonable to initiate statin therapy at any age.  Cardiology referral should be considered for patients with CAC scores >=400 or >=75th percentile.  *2018 AHA/ACC/AACVPR/AAPA/ABC/ACPM/ADA/AGS/APhA/ASPC/NLA/PCNA Guideline on the Management of Blood Cholesterol: A Report of the American College of Cardiology/American Heart Association Task Force on Clinical Practice Guidelines. J Am Coll Cardiol. 2019;73(24):3168-3209.  Redell Shallow, MD  Electronically Signed: By: Redell Shallow M.D. On: 03/10/2022 12:57     ______________________________________________________________________________________________      EKG:  Recent Labs: 11/02/2023: Hemoglobin 14.8; Platelets 161.0; TSH 1.83 11/16/2023: ALT 25; BUN 16; Creatinine, Ser 1.09; Potassium 3.8; Sodium 138  Recent Lipid Panel    Component Value Date/Time   CHOL 145 11/02/2023 1225   TRIG 236.0 (H) 11/02/2023 1225   HDL 59.90 11/02/2023 1225   CHOLHDL 2 11/02/2023 1225   VLDL 47.2 (H) 11/02/2023 1225   LDLCALC  38 11/02/2023 1225   LDLCALC 71 06/18/2020 0956   LDLDIRECT 79.0 06/10/2017 0948        Physical Exam:    VS:  BP (!) 160/82   Pulse 88   Ht 5' 7 (1.702 m)   Wt 158 lb 9.6 oz (71.9 kg)   SpO2 99%   BMI 24.84 kg/m     Wt Readings from Last 3 Encounters:  03/20/24 158 lb 9.6 oz (71.9 kg)  11/02/23 159 lb (72.1 kg)  08/10/23 159 lb 8 oz (72.3 kg)     GEN:  Well nourished, well developed in no acute distress HEENT: Normal NECK: No JVD; No carotid bruits LYMPHATICS: No lymphadenopathy CARDIAC: RRR, no murmurs, rubs, gallops RESPIRATORY:  Clear to auscultation without rales, wheezing or rhonchi  ABDOMEN: Soft, non-tender, non-distended MUSCULOSKELETAL:  No edema; No deformity  SKIN: Warm and dry NEUROLOGIC:  Alert and oriented x 3 PSYCHIATRIC:  Normal affect   Assessment & Plan Essential hypertension Blood pressure is well-controlled on lisinopril .  While his office blood pressure is elevated, he brings in home readings with blood pressure demonstrating excellent control, ranging from 120/69 to a high of 136/80.  No change recommended.  Labs reviewed with a creatinine of 1.09 and potassium of 3.8. Mixed hyperlipidemia Treated with pravastatin .  LDL cholesterol is 38. Near syncope We discussed further evaluation.  I recommended an echocardiogram to evaluate for any structural heart abnormality.  His physical exam is normal.  I advised him about increasing his fluid intake.  He he reports that he is clearly not drinking enough.  We discussed outpatient telemetry such as a ZIO monitor.  He has used this in the past and had a terrible time with skin irritation.  States that he would not be able to do any monitoring again.  We gave him information on a Kardia mobile device that I think would be a good consideration especially with the infrequent nature of his events.  His twelve-lead EKG is normal and I have a low clinical suspicion of high-grade AV block, but he could have some type of  supraventricular arrhythmia that is transient in nature.     Medication Adjustments/Labs and Tests Ordered: Current medicines are reviewed at length with the patient today.  Concerns regarding medicines are outlined above.  Orders Placed This Encounter  Procedures   ECHOCARDIOGRAM COMPLETE   No orders of the defined types were placed in this encounter.   Patient Instructions  Medication Instructions:  No medication changes were made at this visit. Continue current regimen.   *If you need a refill on your cardiac medications before your next appointment, please call your pharmacy*  Lab Work: None ordered today. If you have labs (blood work) drawn today and your tests are completely normal, you will receive your results only by: MyChart Message (if you have MyChart) OR A paper copy in the mail If you have any lab test that is abnormal or we need to change your treatment, we will call you to review the results.  Testing/Procedures: Your physician has requested that you have an echocardiogram. Echocardiography is  a painless test that uses sound waves to create images of your heart. It provides your doctor with information about the size and shape of your heart and how well your heart's chambers and valves are working. This procedure takes approximately one hour. There are no restrictions for this procedure. Please do NOT wear cologne, perfume, aftershave, or lotions (deodorant is allowed). Please arrive 15 minutes prior to your appointment time.  Please note: We ask at that you not bring children with you during ultrasound (echo/ vascular) testing. Due to room size and safety concerns, children are not allowed in the ultrasound rooms during exams. Our front office staff cannot provide observation of children in our lobby area while testing is being conducted. An adult accompanying a patient to their appointment will only be allowed in the ultrasound room at the discretion of the ultrasound  technician under special circumstances. We apologize for any inconvenience.   Follow-Up: At [facility], you and your health needs are our priority.  As part of our continuing mission to provide you with exceptional heart care, our providers are all part of one team.  This team includes your primary Cardiologist (physician) and Advanced Practice Providers or APPs (Physician Assistants and Nurse Practitioners) who all work together to provide you with the care you need, when you need it.  Your next appointment:   As needed  Provider:   Ozell Fell, MD    We recommend signing up for the patient portal called MyChart.  Sign up information is provided on this After Visit Summary.  MyChart is used to connect with patients for Virtual Visits (Telemedicine).  Patients are able to view lab/test results, encounter notes, upcoming appointments, etc.  Non-urgent messages can be sent to your provider as well.   To learn more about what you can do with MyChart, go to [email or web address].   Other Instructions       Introducing KardiaBand, the new wearable EKG by [employer]. Clemente replaces your original Apple Watch band. The first of its kind, FDA-cleared KardiaBand provides accurate and instant analysis for detecting atrial fibrillation (AF) and normal sinus rhythm in an EKG. Simply place your thumb on the integrated KardiaBand sensor to take a medical-grade EKG in just 30 seconds. Results appear instantly on your Apple Watch. Clemente is available today for just $199. KardiaBand features are designed exclusively for use with Kardia Premium membership - $99 year. The Kardia Premium app for [employer] includes [employer]'s revolutionary SmartRhythm monitoring feature. SmartRhythm monitoring uses an intelligent neural network that runs directly on the [employer], constantly acquiring data from the watch's heart rate sensor and its accelerometer. SmartRhythm compares your heart rate to  what it expects from your minute-by-minute level of activity. When the network sees a pattern of heart rate and activity that it does not expect, it notifies you to take an EKG. With Fate, peace of mind is just an EKG away.  SABRA Habermann [email or web address]        FDA-cleared, clinical grade mobile EKG monitor: Crist is the most clinically-validated mobile EKG used by the world's leading cardiac care medical professionals With Basic service, know instantly if your heart rhythm is normal or if atrial fibrillation is detected, and email the last single EKG recording to yourself or your doctor Premium service, available for purchase through the Kardia app for $9.99 per month or $99 per year, includes unlimited history and storage of your EKG recordings, a monthly EKG summary report to share with your  doctor, along with the ability to track your blood pressure, activity and weight Includes one KardiaMobile phone clip FREE SHIPPING: Standard delivery 1-3 business days. Orders placed by 11:00am PST will ship that afternoon. Otherwise, will ship next business day. All orders ship via [employer] from [place], [place]    Step One- Record your EKG strip on [employer].   Step two- On Kardia EKG click "Download"   Step three- It will prompt you to make a password for this EKG. Please make the password "[email or web address]" so that we can view it.   Step four- Click on the little "upload" button (small box with an arrow in the middle) in the bottom left-hand corner of the screen.   Step five- Click "Save to Files"  Step six- Click on "On my iphone" and then "Pages" then press save in the top right-hand corner.   NOW GO TO MYCHART   Once on MyChart click "Messages"  Step one- Click "Send a message"  Step two- Click "Ask a medical question"   Step three- Click "Non urgent medical question"   Step four- Click on [facility] name.  Step five- Click on the  small paperclip at the bottom of the screen  Step six- Click "Choose file"  Step seven- Pick the most recent EKG strip listed.   Once uploaded send the message!      Signed, Ozell Fell, MD  03/20/2024 4:44 PM     [facility]

## 2024-03-20 NOTE — Assessment & Plan Note (Signed)
 Treated with pravastatin .  LDL cholesterol is 38.

## 2024-03-29 ENCOUNTER — Encounter: Payer: Self-pay | Admitting: Physician Assistant

## 2024-04-12 ENCOUNTER — Ambulatory Visit (HOSPITAL_COMMUNITY)
Admission: RE | Admit: 2024-04-12 | Discharge: 2024-04-12 | Disposition: A | Discharge status: Home / Self Care | Source: Ambulatory Visit | Attending: Cardiovascular Disease | Admitting: Cardiovascular Disease

## 2024-04-12 DIAGNOSIS — I10 Essential (primary) hypertension: Secondary | ICD-10-CM | POA: Insufficient documentation

## 2024-04-13 LAB — ECHOCARDIOGRAM COMPLETE
Area-P 1/2: 6.32 cm2
S' Lateral: 1.95 cm

## 2024-04-14 ENCOUNTER — Ambulatory Visit: Payer: Self-pay | Admitting: Cardiovascular Disease

## 2024-05-19 ENCOUNTER — Other Ambulatory Visit: Payer: Self-pay | Admitting: Physician Assistant

## 2024-06-05 LAB — OPHTHALMOLOGY REPORT-SCANNED

## 2024-06-28 ENCOUNTER — Ambulatory Visit: Payer: Self-pay | Admitting: Physician Assistant

## 2024-06-28 ENCOUNTER — Encounter: Payer: Self-pay | Admitting: Physician Assistant

## 2024-06-28 ENCOUNTER — Ambulatory Visit: Payer: Medicare Other | Admitting: Physician Assistant

## 2024-06-28 VITALS — BP 132/82 | HR 97 | Temp 97.9°F | Ht 67.0 in | Wt 157.5 lb

## 2024-06-28 DIAGNOSIS — K5900 Constipation, unspecified: Secondary | ICD-10-CM

## 2024-06-28 DIAGNOSIS — E88819 Insulin resistance, unspecified: Secondary | ICD-10-CM | POA: Diagnosis not present

## 2024-06-28 DIAGNOSIS — Z0000 Encounter for general adult medical examination without abnormal findings: Secondary | ICD-10-CM | POA: Diagnosis not present

## 2024-06-28 DIAGNOSIS — Z8546 Personal history of malignant neoplasm of prostate: Secondary | ICD-10-CM | POA: Diagnosis not present

## 2024-06-28 DIAGNOSIS — E785 Hyperlipidemia, unspecified: Secondary | ICD-10-CM | POA: Diagnosis not present

## 2024-06-28 DIAGNOSIS — I10 Essential (primary) hypertension: Secondary | ICD-10-CM

## 2024-06-28 LAB — COMPREHENSIVE METABOLIC PANEL WITH GFR
ALT: 26 U/L (ref 3–53)
AST: 19 U/L (ref 5–37)
Albumin: 4.2 g/dL (ref 3.5–5.2)
Alkaline Phosphatase: 71 U/L (ref 39–117)
BUN: 19 mg/dL (ref 6–23)
CO2: 30 meq/L (ref 19–32)
Calcium: 9.1 mg/dL (ref 8.4–10.5)
Chloride: 102 meq/L (ref 96–112)
Creatinine, Ser: 1.08 mg/dL (ref 0.40–1.50)
GFR: 64.61 mL/min (ref >60.00)
Glucose, Bld: 124 mg/dL — ABNORMAL HIGH (ref 70–99)
Potassium: 4 meq/L (ref 3.5–5.1)
Sodium: 140 meq/L (ref 135–145)
Total Bilirubin: 0.9 mg/dL (ref 0.2–1.2)
Total Protein: 6.5 g/dL (ref 6.0–8.3)

## 2024-06-28 LAB — LIPID PANEL
Cholesterol: 119 mg/dL (ref 28–200)
HDL: 62.6 mg/dL (ref >39.00)
LDL Cholesterol: 32 mg/dL (ref 10–99)
NonHDL: 56.28
Total CHOL/HDL Ratio: 2
Triglycerides: 120 mg/dL (ref 10.0–149.0)
VLDL: 24 mg/dL (ref 0.0–40.0)

## 2024-06-28 LAB — CBC WITH DIFFERENTIAL/PLATELET
Basophils Absolute: 0 K/uL (ref 0.0–0.1)
Basophils Relative: 0.4 % (ref 0.0–3.0)
Eosinophils Absolute: 0.1 K/uL (ref 0.0–0.7)
Eosinophils Relative: 1.2 % (ref 0.0–5.0)
HCT: 43.1 % (ref 39.0–52.0)
Hemoglobin: 14.7 g/dL (ref 13.0–17.0)
Lymphocytes Relative: 25.1 % (ref 12.0–46.0)
Lymphs Abs: 1.3 K/uL (ref 0.7–4.0)
MCHC: 34.1 g/dL (ref 30.0–36.0)
MCV: 90.7 fl (ref 78.0–100.0)
Monocytes Absolute: 0.5 K/uL (ref 0.1–1.0)
Monocytes Relative: 10.5 % (ref 3.0–12.0)
Neutro Abs: 3.1 K/uL (ref 1.4–7.7)
Neutrophils Relative %: 62.8 % (ref 43.0–77.0)
Platelets: 154 K/uL (ref 150.0–400.0)
RBC: 4.75 Mil/uL (ref 4.22–5.81)
RDW: 13.5 % (ref 11.5–15.5)
WBC: 5 K/uL (ref 4.0–10.5)

## 2024-06-28 LAB — PSA: PSA: 0 ng/mL — ABNORMAL LOW (ref 0.10–4.00)

## 2024-06-28 LAB — HEMOGLOBIN A1C: Hgb A1c MFr Bld: 6.3 % (ref 4.6–6.5)

## 2024-06-28 NOTE — Progress Notes (Signed)
 Subjective:    Kenneth Salazar is a 78 y.o. male and is here for a comprehensive physical exam.  HPI  Health Maintenance Due  Topic Date Due   Medicare Annual Wellness (AWV)  07/04/2024   Discussed the use of AI scribe software for clinical note transcription with the patient, who gave verbal consent to proceed.  History of Present Illness   Kenneth Salazar is an 78 year old male who presents for a follow-up visit.  He has persistent nasal congestion with one side constantly clogged, watery eyes, and allergies. He uses Nasacort . He has shortness of breath but no chest pain or palpitations.  He is concerned about rising blood glucose. His smartwatch readings range from 99 to 155 mg/dL. He checks fingerstick glucose every other day and had a fasting value of 152 mg/dL this morning. His last A1c seven months ago was 6.4%.  He has macular degeneration that is stable under regular ophthalmology care. He sometimes has double vision on waking from certain positions that resolves after he sits up.  He has history of prostate cancer without new symptoms. He wakes hourly at night to urinate, which is unchanged.  He has occasional morning headaches that resolve later in the day. Advil did not help, so he does not take medication.  He strains with bowel movements despite a store brand stool softener and daily Metamucil. He limits Metamucil to once daily because twice daily causes heartburn.  He exercises daily for about 20 minutes with stretching, weights, and pushups, and walks in the house while counting steps. He rarely drinks alcohol, having an occasional beer after dinner. His mood is good.       Health Maintenance: Immunizations -- UpToDate  Colonoscopy -- due in 2029 PSA --  Lab Results  Component Value Date   PSA 0.00 (L) 08/02/2023   PSA 0.01 (L) 06/28/2023   PSA 0.00 (L) 06/25/2022   Diet -- overall well balanced Sleep habits -- overall good but often  interrupted Exercise -- stretching and calisthenics daily, walks a mile daily  Weight -- Weight: 157 lb 8 oz (71.4 kg)  Recent weight history Wt Readings from Last 10 Encounters:  06/28/24 157 lb 8 oz (71.4 kg)  03/20/24 158 lb 9.6 oz (71.9 kg)  11/02/23 159 lb (72.1 kg)  08/10/23 159 lb 8 oz (72.3 kg)  07/05/23 154 lb 12.8 oz (70.2 kg)  06/28/23 154 lb 8 oz (70.1 kg)  06/25/22 153 lb 12.8 oz (69.8 kg)  02/11/22 158 lb 8 oz (71.9 kg)  02/09/22 159 lb (72.1 kg)  09/22/21 165 lb 6.4 oz (75 kg)   Body mass index is 24.67 kg/m.  Mood -- stable Alcohol use --  reports that he does not currently use alcohol after a past usage of about 1.0 standard drink of alcohol per week.  Tobacco use --  Tobacco Use: Low Risk (06/28/2024)   Patient History    Smoking Tobacco Use: Never    Smokeless Tobacco Use: Never    Passive Exposure: Not on file    Eligible for Low Dose CT? no  UTD with eye doctor? yes UTD with dentist? yes     06/28/2024    8:02 AM  Depression screen PHQ 2/9  Decreased Interest 0  Down, Depressed, Hopeless 0  PHQ - 2 Score 0    Other providers/specialists: Patient Care Team: Job Lukes, [place] as PCP - General (Physician Assistant) Wonda Sharper, MD as PCP - Cardiology (Cardiology) Gastroenterology, Margarete as  Consulting Physician (Gastroenterology) Abigail Maude [facility] as Consulting Physician (Optometry) Dentistry, [facility] as Consulting Physician (Dentistry)    PMHx, SurgHx, SocialHx, Medications, and Allergies were reviewed in the Visit Navigator and updated as appropriate.   Past Medical History:  Diagnosis Date   Allergy 2005   Arthritis    Cancer [facility])    prostate 01-2015   Hyperlipidemia    Hypertension    no meds     Past Surgical History:  Procedure Laterality Date   CARDIAC CATHETERIZATION  2001   COLONOSCOPY  2013, 07/2015   Dr.Schooler   fatty tissue removed chin     HERNIA REPAIR     MINOR EXCISION OF ORAL LESION      PROSTATE SURGERY     reomoved 01-2015   VASECTOMY  1977     Family History  Problem Relation Age of Onset   Diabetes Mother    Hearing loss Mother    Heart disease Mother    Hypertension Mother    Obesity Mother    Stroke Father    Diabetes Maternal Uncle    Colon cancer Neg Hx    Colon polyps Neg Hx    Esophageal cancer Neg Hx    Rectal cancer Neg Hx    Stomach cancer Neg Hx     Social History[1]  Review of Systems:   Review of Systems  Constitutional:  Negative for chills, fever, malaise/fatigue and weight loss.  HENT:  Negative for hearing loss, sinus pain and sore throat.   Respiratory:  Negative for cough and hemoptysis.   Cardiovascular:  Negative for chest pain, palpitations, leg swelling and PND.  Gastrointestinal:  Positive for constipation. Negative for abdominal pain, diarrhea, heartburn, nausea and vomiting.  Genitourinary:  Negative for dysuria, frequency and urgency.  Musculoskeletal:  Negative for back pain, myalgias and neck pain.  Skin:  Negative for itching and rash.  Neurological:  Negative for dizziness, tingling, seizures and headaches.  Endo/Heme/Allergies:  Negative for polydipsia.  Psychiatric/Behavioral:  Negative for depression. The patient is not nervous/anxious.     Objective:    Vitals:   06/28/24 0802 06/28/24 0823  BP: (!) 160/80 132/82  Pulse: 97   Temp: 97.9 F (36.6 C)   SpO2: 98%     Body mass index is 24.67 kg/m.  General  Alert, cooperative, no distress, appears stated age  Head:  Normocephalic, without obvious abnormality, atraumatic  Eyes:  PERRL, conjunctiva/corneas clear, EOM's intact, fundi benign, both eyes       Ears:  Normal TM's and external ear canals, both ears  Nose: Nares normal, septum midline, mucosa normal, no drainage or sinus tenderness  Throat: Lips, mucosa, and tongue normal; teeth and gums normal  Neck: Supple, symmetrical, trachea midline, no adenopathy;     thyroid :  No  enlargement/tenderness/nodules; no carotid bruit or JVD  Back:   Symmetric, no curvature, ROM normal, no CVA tenderness  Lungs:   Clear to auscultation bilaterally, respirations unlabored  Chest wall:  No tenderness or deformity  Heart:  Regular rate and rhythm, S1 and S2 normal, no murmur, rub or gallop  Abdomen:   Soft, non-tender, bowel sounds active all four quadrants, no masses, no organomegaly  Extremities: Extremities normal, atraumatic, no cyanosis or edema  Prostate : Deferred   Skin: Skin color, texture, turgor normal, no rashes or lesions  Lymph nodes: Cervical, supraclavicular, and axillary nodes normal  Neurologic: CNII-XII grossly intact. Normal strength, sensation and reflexes throughout   AssessmentPlan:  Assessment and Plan    General Health Maintenance Up to date on vaccinations and screenings. Colonoscopy due 2029. Engages in regular physical activity and maintains a healthy lifestyle. - Continue regular physical activity.   Insulin resistance Blood glucose elevated at 152 mg/dL. A1c was 6.4% seven months ago. A1c goal under 8%. - Checked A1c level.  Hypertension Blood pressure readings variable. Recent home reading 132/82 mmHg. Office readings elevated but not concerning. - Continue current antihypertensive regimen.  Constipation Straining during bowel movements despite stool softener and Metamucil. Heartburn with Metamucil. - Start Miralax daily.  History of prostate cancer No current symptoms or concerns. PSA monitored annually. - Continue annual PSA monitoring.  HTN , essential Well controlled Continue lisinopril  10 mg daily Follow up if becomes uncontrolled  HLD Tolerating statin Update lipid panel and adjust pravastatin  20 mg daily as indicated           Lucie Buttner, PA-C [facility]         [1]  Social History Tobacco Use   Smoking status: Never   Smokeless tobacco: Never  Vaping Use   Vaping status: Never  Used  Substance Use Topics   Alcohol use: Not Currently    Alcohol/week: 1.0 standard drink of alcohol    Types: 1 Cans of beer per week    Comment: Rarely   Drug use: Never

## 2024-08-01 ENCOUNTER — Other Ambulatory Visit: Payer: Self-pay | Admitting: Medical Genetics

## 2024-08-03 ENCOUNTER — Ambulatory Visit

## 2024-08-03 VITALS — BP 119/64 | Temp 98.9°F | Ht 67.0 in | Wt 157.0 lb

## 2024-08-03 DIAGNOSIS — Z0000 Encounter for general adult medical examination without abnormal findings: Secondary | ICD-10-CM

## 2024-08-03 NOTE — Patient Instructions (Signed)
 Kenneth Salazar,  Thank you for taking the time for your Medicare Wellness Visit. I appreciate your continued commitment to your health goals. Please review the care plan we discussed, and feel free to reach out if I can assist you further.  Please note that Annual Wellness Visits do not include a physical exam. Some assessments may be limited, especially if the visit was conducted virtually. If needed, we may recommend an in-person follow-up with your provider.  Ongoing Care Seeing your primary care provider every 3 to 6 months helps us  monitor your health and provide consistent, personalized care.   Referrals If a referral was made during today's visit and you haven't received any updates within two weeks, please contact the referred provider directly to check on the status.  Recommended Screenings:  Health Maintenance  Topic Date Due   COVID-19 Vaccine (9 - 2025-26 season) 06/28/2025*   Medicare Annual Wellness Visit  08/03/2025   Colon Cancer Screening  07/20/2027   DTaP/Tdap/Td vaccine (3 - Td or Tdap) 06/14/2028   Pneumococcal Vaccine for age over 84  Completed   Flu Shot  Completed   Zoster (Shingles) Vaccine  Completed   Meningitis B Vaccine  Aged Out   Hepatitis C Screening  Discontinued  *Topic was postponed. The date shown is not the original due date.       07/31/2024    6:17 PM  Advanced Directives  Does Patient Have a Medical Advance Directive? Yes  Type of Advance Directive Healthcare Power of Attorney  Copy of Healthcare Power of Attorney in Chart? No - copy requested    Vision: Annual vision screenings are recommended for early detection of glaucoma, cataracts, and diabetic retinopathy. These exams can also reveal signs of chronic conditions such as diabetes and high blood pressure.  Dental: Annual dental screenings help detect early signs of oral cancer, gum disease, and other conditions linked to overall health, including heart disease and diabetes.  Please see  the attached documents for additional preventive care recommendations.

## 2024-08-03 NOTE — Progress Notes (Signed)
 "  Chief Complaint  Patient presents with   Medicare Wellness     Subjective:   Kenneth Salazar is a 79 y.o. male who presents for a Medicare Annual Wellness Visit.  Visit info / Clinical Intake: Medicare Wellness Visit Type:: Subsequent Annual Wellness Visit Persons participating in visit and providing information:: patient Medicare Wellness Visit Mode:: Telephone If telephone:: video declined Since this visit was completed virtually, some vitals may be partially provided or unavailable. Missing vitals are due to the limitations of the virtual format.: Documented vitals are patient reported If Telephone or Video please confirm:: I connected with patient using audio/video enable telemedicine. I verified patient identity with two identifiers, discussed telehealth limitations, and patient agreed to proceed. Patient Location:: home Provider Location:: office Interpreter Needed?: No Pre-visit prep was completed: yes AWV questionnaire completed by patient prior to visit?: yes Date:: 07/31/24 Living arrangements:: (Patient-Rptd) lives with spouse/significant other Patient's Overall Health Status Rating: (Patient-Rptd) very good Typical amount of pain: (Patient-Rptd) some Does pain affect daily life?: (Patient-Rptd) no Are you currently prescribed opioids?: no  Dietary Habits and Nutritional Risks How many meals a day?: (Patient-Rptd) 3 Eats fruit and vegetables daily?: (Patient-Rptd) yes Most meals are obtained by: (Patient-Rptd) preparing own meals; eating out; having others provide food In the last 2 weeks, have you had any of the following?: none Diabetic:: no  Functional Status Activities of Daily Living (to include ambulation/medication): (Patient-Rptd) Independent Ambulation: Independent with device- listed below Home Assistive Devices/Equipment: Eyeglasses Medication Administration: (Patient-Rptd) Independent Home Management (perform basic housework or laundry):  (Patient-Rptd) Independent Manage your own finances?: (Patient-Rptd) yes Primary transportation is: (Patient-Rptd) driving Concerns about vision?: no *vision screening is required for WTM* Concerns about hearing?: (!) yes Uses hearing aids?: (!) yes Hear whispered voice?: yes  Fall Screening Falls in the past year?: (Patient-Rptd) 0 Number of falls in past year: 0 Was there an injury with Fall?: 0 Fall Risk Category Calculator: 0 Patient Fall Risk Level: Low Fall Risk  Fall Risk Patient at Risk for Falls Due to: History of fall(s) Fall risk Follow up: Falls evaluation completed  Home and Transportation Safety: All rugs have non-skid backing?: (!) (Patient-Rptd) no All stairs or steps have railings?: (Patient-Rptd) yes Grab bars in the bathtub or shower?: (Patient-Rptd) yes Have non-skid surface in bathtub or shower?: (Patient-Rptd) yes Good home lighting?: (Patient-Rptd) yes Regular seat belt use?: (Patient-Rptd) yes Hospital stays in the last year:: (Patient-Rptd) no  Cognitive Assessment Difficulty concentrating, remembering, or making decisions? : (Patient-Rptd) no Will 6CIT or Mini Cog be Completed: yes What year is it?: 0 points What month is it?: 0 points Give patient an address phrase to remember (5 components): [address] [place]  About what time is it?: 0 points Count backwards from 20 to 1: 0 points Say the months of the year in reverse: 0 points Repeat the address phrase from earlier: 0 points 6 CIT Score: 0 points  Advance Directives (For Healthcare) Does Patient Have a Medical Advance Directive?: Yes Type of Advance Directive: Healthcare Power of Attorney Copy of Healthcare Power of Attorney in Chart?: No - copy requested  Reviewed/Updated  Reviewed/Updated: Reviewed All (Medical, Surgical, Family, Medications, Allergies, Care Teams, Patient Goals)    Allergies (verified) Sulfa antibiotics; Antihistamines, chlorpheniramine-type; Antihistamines,  diphenhydramine-type; Antihistamines, loratadine-type; and Percocet [oxycodone-acetaminophen]   Current Medications (verified) Outpatient Encounter Medications as of 08/03/2024  Medication Sig   aspirin EC 81 MG tablet Take 81 mg by mouth daily.    ipratropium (ATROVENT ) 0.06 %  nasal spray USE 2 SPRAYS IN BOTH NOSTRILS 4  TIMES DAILY   lisinopril  (ZESTRIL ) 10 MG tablet TAKE 1 TABLET BY MOUTH DAILY   Multiple Vitamin (MULTIVITAMIN) tablet Take 1 tablet by mouth daily.    Multiple Vitamins-Minerals (PRESERVISION AREDS PO) Take by mouth.   pravastatin  (PRAVACHOL ) 20 MG tablet TAKE 1 TABLET BY MOUTH DAILY   Psyllium (METAMUCIL MULTIHEALTH FIBER PO)    No facility-administered encounter medications on file as of 08/03/2024.    History: Past Medical History:  Diagnosis Date   Allergy 2005   Arthritis    Cancer (HCC)    prostate 01-2015   Hyperlipidemia    Hypertension    no meds   Past Surgical History:  Procedure Laterality Date   CARDIAC CATHETERIZATION  2001   COLONOSCOPY  2013, 07/2015   Dr.Schooler   fatty tissue removed chin     HERNIA REPAIR     MINOR EXCISION OF ORAL LESION     PROSTATE SURGERY     reomoved 01-2015   VASECTOMY  1977   Family History  Problem Relation Age of Onset   Diabetes Mother    Hearing loss Mother    Heart disease Mother    Hypertension Mother    Obesity Mother    Stroke Father    Diabetes Maternal Uncle    Colon cancer Neg Hx    Colon polyps Neg Hx    Esophageal cancer Neg Hx    Rectal cancer Neg Hx    Stomach cancer Neg Hx    Social History   Occupational History   Occupation: retired    Comment: maintenance via animator   Tobacco Use   Smoking status: Never   Smokeless tobacco: Never  Vaping Use   Vaping status: Never Used  Substance and Sexual Activity   Alcohol use: Not Currently    Alcohol/week: 1.0 standard drink of alcohol    Types: 1 Cans of beer per week    Comment: Rarely   Drug use: Never   Sexual activity: Not  Currently   Tobacco Counseling Counseling given: Not Answered  SDOH Screenings   Food Insecurity: No Food Insecurity (08/03/2024)  Housing: Low Risk (08/03/2024)  Transportation Needs: No Transportation Needs (08/03/2024)  Utilities: Not At Risk (08/03/2024)  Alcohol Screen: Low Risk (06/22/2024)  Depression (PHQ2-9): Low Risk (08/03/2024)  Financial Resource Strain: Low Risk (08/03/2024)  Physical Activity: Insufficiently Active (08/03/2024)  Social Connections: Unknown (08/03/2024)  Stress: No Stress Concern Present (08/03/2024)  Tobacco Use: Low Risk (08/03/2024)  Health Literacy: Adequate Health Literacy (08/03/2024)   See flowsheets for full screening details  Depression Screen PHQ 2 & 9 Depression Scale- Over the past 2 weeks, how often have you been bothered by any of the following problems? Little interest or pleasure in doing things: 0 Feeling down, depressed, or hopeless (PHQ Adolescent also includes...irritable): 0 PHQ-2 Total Score: 0     Goals Addressed               This Visit's Progress     maintain health and activity (pt-stated)               Objective:    Today's Vitals   08/03/24 1256  BP: 119/64  Temp: 98.9 F (37.2 C)  Weight: 157 lb (71.2 kg)  Height: 5' 7 (1.702 m)   Body mass index is 24.59 kg/m.  Hearing/Vision screen No results found. Immunizations and Health Maintenance Health Maintenance  Topic Date Due   COVID-19  Vaccine (9 - 2025-26 season) 06/28/2025 (Originally 03/13/2024)   Medicare Annual Wellness (AWV)  08/03/2025   Colonoscopy  07/20/2027   DTaP/Tdap/Td (3 - Td or Tdap) 06/14/2028   Pneumococcal Vaccine: 50+ Years  Completed   Influenza Vaccine  Completed   Zoster Vaccines- Shingrix  Completed   Meningococcal B Vaccine  Aged Out   Hepatitis C Screening  Discontinued        Assessment/Plan:  This is a routine wellness examination for Kenneth Salazar.  Patient Care Team: Job Lukes, [place] as PCP - General (Physician  Assistant) Wonda Sharper, MD as PCP - Cardiology (Cardiology) Gastroenterology, Margarete as Consulting Physician (Gastroenterology) Abigail Maude POUR as Consulting Physician (Optometry) Dentistry, [facility] as Consulting Physician (Dentistry)  I have personally reviewed and noted the following in the patients chart:   Medical and social history Use of alcohol, tobacco or illicit drugs  Current medications and supplements including opioid prescriptions. Functional ability and status Nutritional status Physical activity Advanced directives List of other physicians Hospitalizations, surgeries, and ER visits in previous 12 months Vitals Screenings to include cognitive, depression, and falls Referrals and appointments  No orders of the defined types were placed in this encounter.  In addition, I have reviewed and discussed with patient certain preventive protocols, quality metrics, and best practice recommendations. A written personalized care plan for preventive services as well as general preventive health recommendations were provided to patient.   Ellouise VEAR Haws, LPN   [id number]/[id number]   Return in 1 year (on 08/03/2025).  After Visit Summary: (MyChart) Due to this being a telephonic visit, the after visit summary with patients personalized plan was offered to patient via MyChart   Nurse Notes: No voiced or noted concerns at this time  "

## 2024-08-07 ENCOUNTER — Ambulatory Visit

## 2024-08-17 ENCOUNTER — Other Ambulatory Visit: Payer: Self-pay | Admitting: Medical Genetics

## 2024-08-17 DIAGNOSIS — Z006 Encounter for examination for normal comparison and control in clinical research program: Secondary | ICD-10-CM

## 2024-09-11 ENCOUNTER — Other Ambulatory Visit (HOSPITAL_COMMUNITY)

## 2025-08-08 ENCOUNTER — Ambulatory Visit
